# Patient Record
Sex: Male | Born: 2000 | Race: White | Hispanic: No | Marital: Single | Smoking: Never smoker
Health system: Southern US, Community
[De-identification: ages and names within clinical notes are randomized; demographics above are authoritative.]

---
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
## 2019-08-18 DIAGNOSIS — K219 Gastro-esophageal reflux disease without esophagitis: Secondary | ICD-10-CM | POA: Diagnosis present

## 2019-08-18 DIAGNOSIS — J3501 Chronic tonsillitis: Secondary | ICD-10-CM | POA: Insufficient documentation

## 2019-08-18 DIAGNOSIS — J029 Acute pharyngitis, unspecified: Secondary | ICD-10-CM | POA: Insufficient documentation

## 2020-05-20 ENCOUNTER — Encounter (HOSPITAL_COMMUNITY): Payer: Self-pay | Admitting: Emergency Medicine

## 2020-05-20 ENCOUNTER — Emergency Department (HOSPITAL_COMMUNITY)
Admission: EM | Admit: 2020-05-20 | Discharge: 2020-05-21 | Disposition: A | Discharge status: Home / Self Care | Payer: BC Managed Care – PPO | Attending: Emergency Medicine | Admitting: Emergency Medicine

## 2020-05-20 ENCOUNTER — Other Ambulatory Visit: Payer: Self-pay

## 2020-05-20 DIAGNOSIS — Z23 Encounter for immunization: Secondary | ICD-10-CM | POA: Diagnosis not present

## 2020-05-20 DIAGNOSIS — Y999 Unspecified external cause status: Secondary | ICD-10-CM | POA: Diagnosis not present

## 2020-05-20 DIAGNOSIS — X58XXXA Exposure to other specified factors, initial encounter: Secondary | ICD-10-CM | POA: Diagnosis not present

## 2020-05-20 DIAGNOSIS — Z203 Contact with and (suspected) exposure to rabies: Secondary | ICD-10-CM | POA: Diagnosis not present

## 2020-05-20 DIAGNOSIS — Y929 Unspecified place or not applicable: Secondary | ICD-10-CM | POA: Diagnosis not present

## 2020-05-20 DIAGNOSIS — S60312A Abrasion of left thumb, initial encounter: Secondary | ICD-10-CM | POA: Diagnosis not present

## 2020-05-20 DIAGNOSIS — Y939 Activity, unspecified: Secondary | ICD-10-CM | POA: Diagnosis not present

## 2020-05-20 DIAGNOSIS — Z2914 Encounter for prophylactic rabies immune globin: Secondary | ICD-10-CM | POA: Insufficient documentation

## 2020-05-20 DIAGNOSIS — Z209 Contact with and (suspected) exposure to unspecified communicable disease: Secondary | ICD-10-CM

## 2020-05-20 MED ORDER — RABIES VACCINE, PCEC IM SUSR
1.0000 mL | Freq: Once | INTRAMUSCULAR | Status: AC
  Administered 2020-05-21: 1 mL via INTRAMUSCULAR
  Filled 2020-05-20: qty 1

## 2020-05-20 MED ORDER — RABIES IMMUNE GLOBULIN 150 UNIT/ML IM INJ
20.0000 [IU]/kg | INJECTION | Freq: Once | INTRAMUSCULAR | Status: AC
  Administered 2020-05-21: 1875 [IU] via INTRAMUSCULAR
  Filled 2020-05-20: qty 12.5
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
## 2020-05-21 DIAGNOSIS — S60312A Abrasion of left thumb, initial encounter: Secondary | ICD-10-CM | POA: Diagnosis not present

## 2020-05-21 NOTE — Discharge Instructions (Addendum)
At this time there does not appear to be the presence of an emergent medical condition, however there is always the potential for conditions to change. Please read and follow the below instructions.  Please return to the Emergency Department immediately for any new or worsening symptoms. Please be sure to follow up with your Primary Care Provider within one week regarding your visit today; please call their office to schedule an appointment even if you are feeling better for a follow-up visit. You will need to return in 3 days for your next rabies vaccination.  Please follow the handout given to you today regarding your rabies vaccine follow-up schedule, shots will be needed on August 29, September 2 and September 9.  You may have these shots administered here at the emergency department or you may discuss follow-up vaccines with your primary care doctor so long as they are able to give him to you on the appropriate days.  Get help right away if you: Are bitten by a wild or stray animal. Have had any direct exposure to a bat. Have any symptoms of rabies infection. Have signs that your wound is infected, including: More redness, swelling, or pain around your wound. Fluid or blood coming from your wound. Pus or a bad smell coming from your wound. Your wound feels warm to the touch. You have any new/concerning or worsening of symptoms  Please read the additional information packets attached to your discharge summary.  Do not take your medicine if  develop an itchy rash, swelling in your mouth or lips, or difficulty breathing; call 911 and seek immediate emergency medical attention if this occurs.  You may review your lab tests and imaging results in their entirety on your MyChart account.  Please discuss all results of fully with your primary care provider and other specialist at your follow-up visit.  Note: Portions of this text may have been transcribed using voice recognition software. Every  effort was made to ensure accuracy; however, inadvertent computerized transcription errors may still be present.

## 2020-05-24 ENCOUNTER — Encounter (HOSPITAL_COMMUNITY): Payer: Self-pay | Admitting: Emergency Medicine

## 2020-05-24 ENCOUNTER — Ambulatory Visit (HOSPITAL_COMMUNITY)
Admission: EM | Admit: 2020-05-24 | Discharge: 2020-05-24 | Disposition: A | Discharge status: Home / Self Care | Payer: PRIVATE HEALTH INSURANCE

## 2020-05-24 ENCOUNTER — Other Ambulatory Visit: Payer: Self-pay

## 2020-05-24 DIAGNOSIS — Z203 Contact with and (suspected) exposure to rabies: Secondary | ICD-10-CM

## 2020-05-24 MED ORDER — RABIES VACCINE, PCEC IM SUSR
1.0000 mL | Freq: Once | INTRAMUSCULAR | Status: AC
  Administered 2020-05-24: 1 mL via INTRAMUSCULAR

## 2020-05-24 MED ORDER — RABIES VACCINE, PCEC IM SUSR
INTRAMUSCULAR | Status: AC
  Filled 2020-05-24: qty 1

## 2020-05-24 NOTE — ED Triage Notes (Signed)
Patient is in department for rabies vaccine

## 2020-05-24 NOTE — Discharge Instructions (Signed)
Return as scheduled for next injection

## 2020-05-27 ENCOUNTER — Ambulatory Visit (HOSPITAL_COMMUNITY)
Admission: EM | Admit: 2020-05-27 | Discharge: 2020-05-27 | Disposition: A | Discharge status: Home / Self Care | Payer: PRIVATE HEALTH INSURANCE | Attending: Urgent Care | Admitting: Urgent Care

## 2020-05-27 ENCOUNTER — Encounter (HOSPITAL_COMMUNITY): Payer: Self-pay | Admitting: Emergency Medicine

## 2020-05-27 ENCOUNTER — Other Ambulatory Visit: Payer: Self-pay

## 2020-05-27 DIAGNOSIS — Z889 Allergy status to unspecified drugs, medicaments and biological substances status: Secondary | ICD-10-CM

## 2020-05-27 DIAGNOSIS — R21 Rash and other nonspecific skin eruption: Secondary | ICD-10-CM | POA: Diagnosis not present

## 2020-05-27 DIAGNOSIS — Z23 Encounter for immunization: Secondary | ICD-10-CM

## 2020-05-27 MED ORDER — RABIES VACCINE, PCEC IM SUSR
1.0000 mL | Freq: Once | INTRAMUSCULAR | Status: AC
  Administered 2020-05-27: 1 mL via INTRAMUSCULAR

## 2020-05-27 MED ORDER — RABIES VACCINE, PCEC IM SUSR
INTRAMUSCULAR | Status: AC
  Filled 2020-05-27: qty 1

## 2020-05-27 MED ORDER — HYDROXYZINE HCL 25 MG PO TABS: 12.5000 mg | ORAL_TABLET | Freq: Three times a day (TID) | ORAL | 0 refills | Status: DC | PRN

## 2020-05-27 NOTE — ED Provider Notes (Signed)
  [facility]   MRN: [id number] DOB: Oct 30, 2000  Subjective:   Nicholas Dyer is a 19 y.o. male presenting for third rabies vaccination.  At triage, patient reported that he has had rashes with the first and second dose.  The first time, he had local reactions.  The second time he had rash over both arms and upset stomach, diarrhea.  Denies having had oral or facial swelling, shortness of breath or chest tightness, vomiting.  No current facility-administered medications for this encounter.  Current Outpatient Medications:  Marland Kitchen  Multiple Vitamin (MULTIVITAMIN) tablet, Take 1 tablet by mouth daily., Disp: , Rfl:    No Known Allergies  History reviewed. No pertinent past medical history.   History reviewed. No pertinent surgical history.  Family History  Problem Relation Age of Onset  . Healthy Mother   . Healthy Father     Social History   Tobacco Use  . Smoking status: Never Smoker  . Smokeless tobacco: Never Used  Substance Use Topics  . Alcohol use: Yes  . Drug use: Never    ROS   Objective:   Vitals: BP 128/84 (BP Location: Left Arm)   Pulse 79   Temp 98.6 F (37 C) (Oral)   Resp 14   SpO2 98%   Physical Exam Constitutional:      General: He is not in acute distress.    Appearance: Normal appearance. He is well-developed. He is not ill-appearing, toxic-appearing or diaphoretic.  HENT:     Head: Normocephalic and atraumatic.     Right Ear: External ear normal.     Left Ear: External ear normal.     Nose: Nose normal.     Mouth/Throat:     Mouth: Mucous membranes are moist.     Pharynx: Oropharynx is clear.     Comments: Airway patent both prior to and after vaccination.  Patient was evaluated 20 minutes after vaccine was administered. Eyes:     General: No scleral icterus.    Extraocular Movements: Extraocular movements intact.     Pupils: Pupils are equal, round, and reactive to light.  Cardiovascular:     Rate and Rhythm: Normal rate  and regular rhythm.     Heart sounds: Normal heart sounds. No murmur heard.  No friction rub. No gallop.   Pulmonary:     Effort: Pulmonary effort is normal. No respiratory distress.     Breath sounds: Normal breath sounds. No stridor. No wheezing, rhonchi or rales.  Skin:    Findings: No rash.  Neurological:     Mental Status: He is alert and oriented to person, place, and time.  Psychiatric:        Mood and Affect: Mood normal.        Behavior: Behavior normal.        Thought Content: Thought content normal.      Assessment and Plan :   PDMP not reviewed this encounter.  1. Rash and nonspecific skin eruption   2. Need for rabies vaccination   3. History of allergic drug reaction     I discussed with patient the likelihood that he is going to have a repeat local reaction.  Recommended hydroxyzine.  Low suspicion for anaphylaxis.  Recommend supportive care otherwise. Counseled patient on potential for adverse effects with medications prescribed/recommended today, ER and return-to-clinic precautions discussed, patient verbalized understanding.    Wallis Bamberg, [place] 05/27/20 1757

## 2020-05-27 NOTE — ED Notes (Signed)
Pt reports he has some pressure in his throat but otherwise feels fine. Vitals are stable

## 2020-05-27 NOTE — ED Triage Notes (Signed)
Pt presents for 3rd rabies vaccine. He states with initial rabies vaccine he had hives later. He states with 2nd vaccine he had hives, vomiting and diarrhea. Provider notified.

## 2020-06-03 ENCOUNTER — Ambulatory Visit (HOSPITAL_COMMUNITY)
Admission: EM | Admit: 2020-06-03 | Discharge: 2020-06-03 | Disposition: A | Discharge status: Home / Self Care | Payer: PRIVATE HEALTH INSURANCE | Attending: Family Medicine | Admitting: Family Medicine

## 2020-06-03 ENCOUNTER — Other Ambulatory Visit: Payer: Self-pay

## 2020-06-03 DIAGNOSIS — Z203 Contact with and (suspected) exposure to rabies: Secondary | ICD-10-CM | POA: Diagnosis not present

## 2020-06-03 MED ORDER — RABIES VACCINE, PCEC IM SUSR
1.0000 mL | Freq: Once | INTRAMUSCULAR | Status: AC
  Administered 2020-06-03: 1 mL via INTRAMUSCULAR

## 2020-06-03 MED ORDER — RABIES VACCINE, PCEC IM SUSR
INTRAMUSCULAR | Status: AC
  Filled 2020-06-03: qty 1

## 2020-06-03 NOTE — ED Triage Notes (Signed)
Pt presents to have final rabies injection. Denies any other complaints.

## 2020-07-31 ENCOUNTER — Observation Stay (HOSPITAL_COMMUNITY)
Admission: EM | Admit: 2020-07-31 | Discharge: 2020-08-01 | Disposition: A | Discharge status: Home / Self Care | Payer: BC Managed Care – PPO | Attending: General Surgery | Admitting: General Surgery

## 2020-07-31 ENCOUNTER — Emergency Department (HOSPITAL_COMMUNITY): Payer: BC Managed Care – PPO | Admitting: Anesthesiology

## 2020-07-31 ENCOUNTER — Encounter (HOSPITAL_COMMUNITY)
Admission: EM | Disposition: A | Discharge status: Home / Self Care | Payer: Self-pay | Source: Home / Self Care | Attending: Emergency Medicine

## 2020-07-31 ENCOUNTER — Emergency Department (HOSPITAL_COMMUNITY): Payer: BC Managed Care – PPO

## 2020-07-31 ENCOUNTER — Other Ambulatory Visit: Payer: Self-pay

## 2020-07-31 ENCOUNTER — Encounter (HOSPITAL_COMMUNITY): Payer: Self-pay

## 2020-07-31 DIAGNOSIS — K3589 Other acute appendicitis without perforation or gangrene: Secondary | ICD-10-CM | POA: Insufficient documentation

## 2020-07-31 DIAGNOSIS — K3580 Unspecified acute appendicitis: Principal | ICD-10-CM | POA: Insufficient documentation

## 2020-07-31 DIAGNOSIS — Z20822 Contact with and (suspected) exposure to covid-19: Secondary | ICD-10-CM | POA: Diagnosis not present

## 2020-07-31 DIAGNOSIS — K219 Gastro-esophageal reflux disease without esophagitis: Secondary | ICD-10-CM | POA: Diagnosis present

## 2020-07-31 DIAGNOSIS — R1010 Upper abdominal pain, unspecified: Secondary | ICD-10-CM | POA: Diagnosis present

## 2020-07-31 HISTORY — PX: LAPAROSCOPIC APPENDECTOMY: SHX408

## 2020-07-31 LAB — CBC WITH DIFFERENTIAL/PLATELET
Abs Immature Granulocytes: 0.07 10*3/uL (ref 0.00–0.07)
Basophils Absolute: 0.1 10*3/uL (ref 0.0–0.1)
Basophils Relative: 0 %
Eosinophils Absolute: 0 10*3/uL (ref 0.0–0.5)
Eosinophils Relative: 0 %
HCT: 48.6 % (ref 39.0–52.0)
Hemoglobin: 16.6 g/dL (ref 13.0–17.0)
Immature Granulocytes: 0 %
Lymphocytes Relative: 4 %
Lymphs Abs: 0.6 10*3/uL — ABNORMAL LOW (ref 0.7–4.0)
MCH: 31.2 pg (ref 26.0–34.0)
MCHC: 34.2 g/dL (ref 30.0–36.0)
MCV: 91.4 fL (ref 80.0–100.0)
Monocytes Absolute: 0.8 10*3/uL (ref 0.1–1.0)
Monocytes Relative: 5 %
Neutro Abs: 14.9 10*3/uL — ABNORMAL HIGH (ref 1.7–7.7)
Neutrophils Relative %: 91 %
Platelets: 341 10*3/uL (ref 150–400)
RBC: 5.32 MIL/uL (ref 4.22–5.81)
RDW: 12.2 % (ref 11.5–15.5)
WBC: 16.4 10*3/uL — ABNORMAL HIGH (ref 4.0–10.5)
nRBC: 0 % (ref 0.0–0.2)

## 2020-07-31 LAB — COMPREHENSIVE METABOLIC PANEL
ALT: 34 U/L (ref 0–44)
AST: 25 U/L (ref 15–41)
Albumin: 5.5 g/dL — ABNORMAL HIGH (ref 3.5–5.0)
Alkaline Phosphatase: 75 U/L (ref 38–126)
Anion gap: 10 (ref 5–15)
BUN: 18 mg/dL (ref 6–20)
CO2: 26 mmol/L (ref 22–32)
Calcium: 9.8 mg/dL (ref 8.9–10.3)
Chloride: 105 mmol/L (ref 98–111)
Creatinine, Ser: 0.98 mg/dL (ref 0.61–1.24)
GFR, Estimated: >60 mL/min (ref >60)
Glucose, Bld: 97 mg/dL (ref 70–99)
Potassium: 4.6 mmol/L (ref 3.5–5.1)
Sodium: 141 mmol/L (ref 135–145)
Total Bilirubin: 1.3 mg/dL — ABNORMAL HIGH (ref 0.3–1.2)
Total Protein: 8.4 g/dL — ABNORMAL HIGH (ref 6.5–8.1)

## 2020-07-31 LAB — URINALYSIS, ROUTINE W REFLEX MICROSCOPIC
Bilirubin Urine: NEGATIVE
Glucose, UA: NEGATIVE mg/dL
Hgb urine dipstick: NEGATIVE
Ketones, ur: 80 mg/dL — AB
Leukocytes,Ua: NEGATIVE
Nitrite: NEGATIVE
Protein, ur: NEGATIVE mg/dL
Specific Gravity, Urine: 1.021 (ref 1.005–1.030)
pH: 5 (ref 5.0–8.0)

## 2020-07-31 LAB — RESPIRATORY PANEL BY RT PCR (FLU A&B, COVID)
Influenza A by PCR: NEGATIVE
Influenza B by PCR: NEGATIVE
SARS Coronavirus 2 by RT PCR: NEGATIVE

## 2020-07-31 LAB — LIPASE, BLOOD: Lipase: 24 U/L (ref 11–51)

## 2020-07-31 SURGERY — APPENDECTOMY, LAPAROSCOPIC
Anesthesia: General | Site: Abdomen

## 2020-07-31 MED ORDER — ONDANSETRON HCL 4 MG/2ML IJ SOLN
INTRAMUSCULAR | Status: AC
  Filled 2020-07-31: qty 2

## 2020-07-31 MED ORDER — SUCCINYLCHOLINE CHLORIDE 200 MG/10ML IV SOSY
PREFILLED_SYRINGE | INTRAVENOUS | Status: AC
  Filled 2020-07-31: qty 10

## 2020-07-31 MED ORDER — METRONIDAZOLE IN NACL 5-0.79 MG/ML-% IV SOLN
500.0000 mg | Freq: Once | INTRAVENOUS | Status: AC
  Administered 2020-07-31: 500 mg via INTRAVENOUS
  Filled 2020-07-31: qty 100

## 2020-07-31 MED ORDER — CHLORHEXIDINE GLUCONATE CLOTH 2 % EX PADS: 6.0000 | MEDICATED_PAD | Freq: Once | CUTANEOUS | Status: DC

## 2020-07-31 MED ORDER — SODIUM CHLORIDE 0.9 % IV SOLN
2.0000 g | INTRAVENOUS | Status: AC
  Administered 2020-07-31: 2 g via INTRAVENOUS
  Filled 2020-07-31: qty 2

## 2020-07-31 MED ORDER — IOHEXOL 300 MG/ML  SOLN
100.0000 mL | Freq: Once | INTRAMUSCULAR | Status: AC | PRN
  Administered 2020-07-31: 100 mL via INTRAVENOUS

## 2020-07-31 MED ORDER — MORPHINE SULFATE (PF) 2 MG/ML IV SOLN
2.0000 mg | Freq: Once | INTRAVENOUS | Status: AC
  Administered 2020-07-31: 2 mg via INTRAVENOUS
  Filled 2020-07-31: qty 1

## 2020-07-31 MED ORDER — SUCCINYLCHOLINE CHLORIDE 200 MG/10ML IV SOSY
PREFILLED_SYRINGE | INTRAVENOUS | Status: DC | PRN
  Administered 2020-07-31: 140 mg via INTRAVENOUS

## 2020-07-31 MED ORDER — ONDANSETRON HCL 4 MG/2ML IJ SOLN
4.0000 mg | Freq: Once | INTRAMUSCULAR | Status: AC
  Administered 2020-07-31: 4 mg via INTRAVENOUS
  Filled 2020-07-31: qty 2

## 2020-07-31 MED ORDER — BUPIVACAINE-EPINEPHRINE (PF) 0.5% -1:200000 IJ SOLN
INTRAMUSCULAR | Status: AC
  Filled 2020-07-31: qty 30

## 2020-07-31 MED ORDER — ROCURONIUM BROMIDE 10 MG/ML (PF) SYRINGE
PREFILLED_SYRINGE | INTRAVENOUS | Status: DC | PRN
  Administered 2020-07-31: 40 mg via INTRAVENOUS

## 2020-07-31 MED ORDER — MEPERIDINE HCL 50 MG/ML IJ SOLN: 6.2500 mg | INTRAMUSCULAR | Status: DC | PRN

## 2020-07-31 MED ORDER — CELECOXIB 200 MG PO CAPS
200.0000 mg | ORAL_CAPSULE | ORAL | Status: AC
  Administered 2020-07-31: 200 mg via ORAL
  Filled 2020-07-31: qty 1

## 2020-07-31 MED ORDER — SODIUM CHLORIDE (PF) 0.9 % IJ SOLN
INTRAMUSCULAR | Status: AC
  Filled 2020-07-31: qty 20

## 2020-07-31 MED ORDER — ACETAMINOPHEN 500 MG PO TABS
1000.0000 mg | ORAL_TABLET | Freq: Four times a day (QID) | ORAL | Status: DC
  Administered 2020-08-01: 1000 mg via ORAL
  Filled 2020-07-31: qty 2

## 2020-07-31 MED ORDER — HYDROXYZINE HCL 25 MG PO TABS: 12.5000 mg | ORAL_TABLET | Freq: Three times a day (TID) | ORAL | Status: DC | PRN

## 2020-07-31 MED ORDER — ROCURONIUM BROMIDE 10 MG/ML (PF) SYRINGE
PREFILLED_SYRINGE | INTRAVENOUS | Status: AC
  Filled 2020-07-31: qty 20

## 2020-07-31 MED ORDER — HYDROMORPHONE HCL 1 MG/ML IJ SOLN
INTRAMUSCULAR | Status: AC
  Filled 2020-07-31: qty 1

## 2020-07-31 MED ORDER — SUGAMMADEX SODIUM 200 MG/2ML IV SOLN
INTRAVENOUS | Status: DC | PRN
  Administered 2020-07-31: 200 mg via INTRAVENOUS

## 2020-07-31 MED ORDER — ENOXAPARIN SODIUM 40 MG/0.4ML ~~LOC~~ SOLN
40.0000 mg | SUBCUTANEOUS | Status: DC
  Administered 2020-08-01: 10:00:00 40 mg via SUBCUTANEOUS
  Filled 2020-07-31: qty 0.4

## 2020-07-31 MED ORDER — DIPHENHYDRAMINE HCL 12.5 MG/5ML PO ELIX: 12.5000 mg | ORAL_SOLUTION | Freq: Four times a day (QID) | ORAL | Status: DC | PRN

## 2020-07-31 MED ORDER — DEXAMETHASONE SODIUM PHOSPHATE 10 MG/ML IJ SOLN
INTRAMUSCULAR | Status: DC | PRN
  Administered 2020-07-31: 10 mg via INTRAVENOUS

## 2020-07-31 MED ORDER — SODIUM CHLORIDE 0.9 % IV BOLUS
1000.0000 mL | Freq: Once | INTRAVENOUS | Status: AC
  Administered 2020-07-31: 1000 mL via INTRAVENOUS

## 2020-07-31 MED ORDER — NALOXONE HCL 0.4 MG/ML IJ SOLN
INTRAMUSCULAR | Status: DC | PRN
  Administered 2020-07-31: 200 ug via INTRAVENOUS

## 2020-07-31 MED ORDER — ONE-DAILY MULTI VITAMINS PO TABS: 1.0000 | ORAL_TABLET | Freq: Every day | ORAL | Status: DC

## 2020-07-31 MED ORDER — MAGNESIUM HYDROXIDE 400 MG/5ML PO SUSP: 30.0000 mL | Freq: Every day | ORAL | Status: DC | PRN

## 2020-07-31 MED ORDER — CEFTRIAXONE SODIUM 2 G IJ SOLR
2.0000 g | Freq: Once | INTRAMUSCULAR | Status: AC
  Administered 2020-07-31: 2 g via INTRAVENOUS
  Filled 2020-07-31: qty 20

## 2020-07-31 MED ORDER — SODIUM CHLORIDE 0.9 % IV SOLN: Freq: Three times a day (TID) | INTRAVENOUS | Status: DC | PRN

## 2020-07-31 MED ORDER — SODIUM CHLORIDE 0.9% FLUSH: 3.0000 mL | Freq: Two times a day (BID) | INTRAVENOUS | Status: DC

## 2020-07-31 MED ORDER — BISMUTH SUBSALICYLATE 262 MG PO CHEW
524.0000 mg | CHEWABLE_TABLET | ORAL | Status: DC | PRN
  Filled 2020-07-31: qty 2

## 2020-07-31 MED ORDER — HYDROMORPHONE HCL 1 MG/ML IJ SOLN
0.2500 mg | INTRAMUSCULAR | Status: DC | PRN
  Administered 2020-07-31 (×4): 0.5 mg via INTRAVENOUS

## 2020-07-31 MED ORDER — PHENYLEPHRINE 40 MCG/ML (10ML) SYRINGE FOR IV PUSH (FOR BLOOD PRESSURE SUPPORT)
PREFILLED_SYRINGE | INTRAVENOUS | Status: AC
  Filled 2020-07-31: qty 20

## 2020-07-31 MED ORDER — SODIUM CHLORIDE 0.9 % IV SOLN
2.0000 g | INTRAVENOUS | Status: DC
  Filled 2020-07-31: qty 20

## 2020-07-31 MED ORDER — SODIUM CHLORIDE (PF) 0.9 % IJ SOLN
INTRAMUSCULAR | Status: DC | PRN
  Administered 2020-07-31: 20 mL

## 2020-07-31 MED ORDER — OXYCODONE HCL 5 MG PO TABS
5.0000 mg | ORAL_TABLET | ORAL | Status: DC | PRN
  Administered 2020-08-01: 10 mg via ORAL
  Filled 2020-07-31: qty 2

## 2020-07-31 MED ORDER — BUPIVACAINE LIPOSOME 1.3 % IJ SUSP
INTRAMUSCULAR | Status: DC | PRN
  Administered 2020-07-31: 20 mL

## 2020-07-31 MED ORDER — 0.9 % SODIUM CHLORIDE (POUR BTL) OPTIME
TOPICAL | Status: DC | PRN
  Administered 2020-07-31: 1000 mL

## 2020-07-31 MED ORDER — LIDOCAINE 2% (20 MG/ML) 5 ML SYRINGE
INTRAMUSCULAR | Status: AC
  Filled 2020-07-31: qty 5

## 2020-07-31 MED ORDER — FENTANYL CITRATE (PF) 250 MCG/5ML IJ SOLN
INTRAMUSCULAR | Status: AC
  Filled 2020-07-31: qty 5

## 2020-07-31 MED ORDER — LACTATED RINGERS IV BOLUS: 1000.0000 mL | Freq: Three times a day (TID) | INTRAVENOUS | Status: DC | PRN

## 2020-07-31 MED ORDER — LACTATED RINGERS IV SOLN: INTRAVENOUS | Status: DC | PRN

## 2020-07-31 MED ORDER — LACTATED RINGERS IV SOLN: INTRAVENOUS | Status: DC

## 2020-07-31 MED ORDER — TRAMADOL HCL 50 MG PO TABS
50.0000 mg | ORAL_TABLET | Freq: Four times a day (QID) | ORAL | Status: DC | PRN
  Administered 2020-07-31 – 2020-08-01 (×2): 50 mg via ORAL
  Filled 2020-07-31 (×2): qty 1

## 2020-07-31 MED ORDER — EPHEDRINE 5 MG/ML INJ
INTRAVENOUS | Status: AC
  Filled 2020-07-31: qty 10

## 2020-07-31 MED ORDER — ONDANSETRON HCL 4 MG/2ML IJ SOLN
INTRAMUSCULAR | Status: DC | PRN
  Administered 2020-07-31: 4 mg via INTRAVENOUS

## 2020-07-31 MED ORDER — MAGIC MOUTHWASH
15.0000 mL | Freq: Four times a day (QID) | ORAL | Status: DC | PRN
  Filled 2020-07-31: qty 15

## 2020-07-31 MED ORDER — SIMETHICONE 80 MG PO CHEW: 40.0000 mg | CHEWABLE_TABLET | Freq: Four times a day (QID) | ORAL | Status: DC | PRN

## 2020-07-31 MED ORDER — BISACODYL 10 MG RE SUPP: 10.0000 mg | Freq: Every day | RECTAL | Status: DC | PRN

## 2020-07-31 MED ORDER — TRAMADOL HCL 50 MG PO TABS: 50.0000 mg | ORAL_TABLET | Freq: Four times a day (QID) | ORAL | 0 refills | Status: DC | PRN

## 2020-07-31 MED ORDER — METOPROLOL TARTRATE 5 MG/5ML IV SOLN: 5.0000 mg | Freq: Four times a day (QID) | INTRAVENOUS | Status: DC | PRN

## 2020-07-31 MED ORDER — LIP MEDEX EX OINT: 1.0000 "application " | TOPICAL_OINTMENT | Freq: Two times a day (BID) | CUTANEOUS | Status: DC

## 2020-07-31 MED ORDER — LIDOCAINE 2% (20 MG/ML) 5 ML SYRINGE
INTRAMUSCULAR | Status: DC | PRN
  Administered 2020-07-31: 80 mg via INTRAVENOUS

## 2020-07-31 MED ORDER — SODIUM CHLORIDE 0.9 % IV SOLN: 250.0000 mL | INTRAVENOUS | Status: DC | PRN

## 2020-07-31 MED ORDER — MIDAZOLAM HCL 5 MG/5ML IJ SOLN
INTRAMUSCULAR | Status: DC | PRN
  Administered 2020-07-31: 2 mg via INTRAVENOUS

## 2020-07-31 MED ORDER — PROPOFOL 10 MG/ML IV BOLUS
INTRAVENOUS | Status: DC | PRN
  Administered 2020-07-31: 130 mg via INTRAVENOUS

## 2020-07-31 MED ORDER — METRONIDAZOLE IN NACL 5-0.79 MG/ML-% IV SOLN
500.0000 mg | Freq: Three times a day (TID) | INTRAVENOUS | Status: DC
  Administered 2020-08-01 (×2): 500 mg via INTRAVENOUS
  Filled 2020-07-31 (×2): qty 100

## 2020-07-31 MED ORDER — PSYLLIUM 95 % PO PACK
1.0000 | PACK | Freq: Every day | ORAL | Status: DC
  Administered 2020-08-01: 1 via ORAL
  Filled 2020-07-31: qty 1

## 2020-07-31 MED ORDER — GABAPENTIN 300 MG PO CAPS
300.0000 mg | ORAL_CAPSULE | Freq: Two times a day (BID) | ORAL | Status: DC
  Administered 2020-07-31 – 2020-08-01 (×2): 300 mg via ORAL
  Filled 2020-07-31 (×2): qty 1

## 2020-07-31 MED ORDER — LACTATED RINGERS IR SOLN
Status: DC | PRN
  Administered 2020-07-31: 1000 mL

## 2020-07-31 MED ORDER — ZOLPIDEM TARTRATE 5 MG PO TABS: 5.0000 mg | ORAL_TABLET | Freq: Every evening | ORAL | Status: DC | PRN

## 2020-07-31 MED ORDER — IBUPROFEN 200 MG PO TABS: 200.0000 mg | ORAL_TABLET | Freq: Four times a day (QID) | ORAL | Status: DC | PRN

## 2020-07-31 MED ORDER — ACETAMINOPHEN 500 MG PO TABS
1000.0000 mg | ORAL_TABLET | ORAL | Status: AC
  Administered 2020-07-31: 1000 mg via ORAL
  Filled 2020-07-31: qty 2

## 2020-07-31 MED ORDER — FENTANYL CITRATE (PF) 100 MCG/2ML IJ SOLN
INTRAMUSCULAR | Status: DC | PRN
  Administered 2020-07-31 (×5): 50 ug via INTRAVENOUS

## 2020-07-31 MED ORDER — BUPIVACAINE-EPINEPHRINE (PF) 0.5% -1:200000 IJ SOLN
INTRAMUSCULAR | Status: DC | PRN
  Administered 2020-07-31: 30 mL

## 2020-07-31 MED ORDER — SODIUM CHLORIDE 0.9% FLUSH: 3.0000 mL | INTRAVENOUS | Status: DC | PRN

## 2020-07-31 MED ORDER — DIPHENHYDRAMINE HCL 50 MG/ML IJ SOLN: 12.5000 mg | Freq: Four times a day (QID) | INTRAMUSCULAR | Status: DC | PRN

## 2020-07-31 MED ORDER — BUPIVACAINE LIPOSOME 1.3 % IJ SUSP
20.0000 mL | Freq: Once | INTRAMUSCULAR | Status: DC
  Filled 2020-07-31: qty 20

## 2020-07-31 MED ORDER — MORPHINE SULFATE (PF) 2 MG/ML IV SOLN: 2.0000 mg | Freq: Once | INTRAVENOUS | Status: DC

## 2020-07-31 MED ORDER — PROPOFOL 10 MG/ML IV BOLUS
INTRAVENOUS | Status: AC
  Filled 2020-07-31: qty 20

## 2020-07-31 MED ORDER — METHOCARBAMOL 500 MG PO TABS
500.0000 mg | ORAL_TABLET | Freq: Four times a day (QID) | ORAL | Status: DC | PRN
  Administered 2020-07-31: 21:00:00 500 mg via ORAL
  Filled 2020-07-31: qty 1

## 2020-07-31 MED ORDER — MIDAZOLAM HCL 2 MG/2ML IJ SOLN
INTRAMUSCULAR | Status: AC
  Filled 2020-07-31: qty 2

## 2020-07-31 MED ORDER — ADULT MULTIVITAMIN W/MINERALS CH
1.0000 | ORAL_TABLET | Freq: Every day | ORAL | Status: DC
  Administered 2020-07-31 – 2020-08-01 (×2): 1 via ORAL
  Filled 2020-07-31 (×2): qty 1

## 2020-07-31 MED ORDER — METHOCARBAMOL 500 MG PO TABS: 1000.0000 mg | ORAL_TABLET | Freq: Four times a day (QID) | ORAL | Status: DC | PRN

## 2020-07-31 MED ORDER — GABAPENTIN 300 MG PO CAPS
300.0000 mg | ORAL_CAPSULE | ORAL | Status: AC
  Administered 2020-07-31: 300 mg via ORAL
  Filled 2020-07-31: qty 1

## 2020-07-31 MED ORDER — ONDANSETRON HCL 4 MG/2ML IJ SOLN: 4.0000 mg | Freq: Once | INTRAMUSCULAR | Status: DC | PRN

## 2020-07-31 SURGICAL SUPPLY — 48 items
APPLIER CLIP 5 13 M/L LIGAMAX5 (MISCELLANEOUS)
APPLIER CLIP ROT 10 11.4 M/L (STAPLE)
CABLE HIGH FREQUENCY MONO STRZ (ELECTRODE) ×2 IMPLANT
CHLORAPREP W/TINT 26 (MISCELLANEOUS) ×2 IMPLANT
CLIP APPLIE 5 13 M/L LIGAMAX5 (MISCELLANEOUS) IMPLANT
CLIP APPLIE ROT 10 11.4 M/L (STAPLE) IMPLANT
COVER SURGICAL LIGHT HANDLE (MISCELLANEOUS) ×2 IMPLANT
COVER WAND RF STERILE (DRAPES) IMPLANT
DECANTER SPIKE VIAL GLASS SM (MISCELLANEOUS) ×2 IMPLANT
DEVICE TROCAR PUNCTURE CLOSURE (ENDOMECHANICALS) IMPLANT
DRAPE LAPAROSCOPIC ABDOMINAL (DRAPES) IMPLANT
DRAPE WARM FLUID 44X44 (DRAPES) ×2 IMPLANT
DRSG TEGADERM 2-3/8X2-3/4 SM (GAUZE/BANDAGES/DRESSINGS) ×2 IMPLANT
DRSG TEGADERM 4X4.75 (GAUZE/BANDAGES/DRESSINGS) ×2 IMPLANT
ELECT REM PT RETURN 15FT ADLT (MISCELLANEOUS) ×2 IMPLANT
ENDOLOOP SUT PDS II  0 18 (SUTURE)
ENDOLOOP SUT PDS II 0 18 (SUTURE) IMPLANT
GAUZE SPONGE 2X2 8PLY STRL LF (GAUZE/BANDAGES/DRESSINGS) ×1 IMPLANT
GLOVE ECLIPSE 8.0 STRL XLNG CF (GLOVE) ×2 IMPLANT
GLOVE INDICATOR 8.0 STRL GRN (GLOVE) ×2 IMPLANT
GOWN STRL REUS W/TWL XL LVL3 (GOWN DISPOSABLE) ×4 IMPLANT
IRRIG SUCT STRYKERFLOW 2 WTIP (MISCELLANEOUS) ×2
IRRIGATION SUCT STRKRFLW 2 WTP (MISCELLANEOUS) ×1 IMPLANT
KIT BASIN OR (CUSTOM PROCEDURE TRAY) ×2 IMPLANT
KIT TURNOVER KIT A (KITS) IMPLANT
PAD POSITIONING PINK XL (MISCELLANEOUS) ×2 IMPLANT
PENCIL SMOKE EVACUATOR (MISCELLANEOUS) IMPLANT
POUCH RETRIEVAL ECOSAC 10 (ENDOMECHANICALS) ×1 IMPLANT
POUCH RETRIEVAL ECOSAC 10MM (ENDOMECHANICALS) ×2
RELOAD STAPLER BLUE 60MM (STAPLE) ×1 IMPLANT
RELOAD STAPLER GREEN 60MM (STAPLE) IMPLANT
SCISSORS LAP 5X35 DISP (ENDOMECHANICALS) ×2 IMPLANT
SET TUBE SMOKE EVAC HIGH FLOW (TUBING) ×2 IMPLANT
SHEARS HARMONIC ACE PLUS 36CM (ENDOMECHANICALS) ×2 IMPLANT
SLEEVE XCEL OPT CAN 5 100 (ENDOMECHANICALS) ×2 IMPLANT
SPONGE GAUZE 2X2 STER 10/PKG (GAUZE/BANDAGES/DRESSINGS) ×1
STAPLE ECHEON FLEX 60 POW ENDO (STAPLE) ×2 IMPLANT
STAPLER RELOAD BLUE 60MM (STAPLE) ×2
STAPLER RELOAD GREEN 60MM (STAPLE)
SUT MNCRL AB 4-0 PS2 18 (SUTURE) ×2 IMPLANT
SUT PDS AB 0 CT1 36 (SUTURE) IMPLANT
SUT PDS AB 1 CT1 27 (SUTURE) IMPLANT
SUT SILK 2 0 SH (SUTURE) IMPLANT
TOWEL OR 17X26 10 PK STRL BLUE (TOWEL DISPOSABLE) ×2 IMPLANT
TRAY FOLEY MTR SLVR 16FR STAT (SET/KITS/TRAYS/PACK) ×2 IMPLANT
TRAY LAPAROSCOPIC (CUSTOM PROCEDURE TRAY) ×2 IMPLANT
TROCAR BLADELESS OPT 5 100 (ENDOMECHANICALS) ×2 IMPLANT
TROCAR XCEL 12X100 BLDLESS (ENDOMECHANICALS) ×2 IMPLANT

## 2020-07-31 NOTE — Discharge Instructions (Signed)
LAPAROSCOPIC SURGERY: POST OP INSTRUCTIONS  ######################################################################  EAT Gradually transition to a high fiber diet with a fiber supplement over the next few weeks after discharge.  Start with a pureed / full liquid diet (see below)  WALK Walk an hour a day.  Control your pain to do that.    CONTROL PAIN Control pain so that you can walk, sleep, tolerate sneezing/coughing, go up/down stairs.  HAVE A BOWEL MOVEMENT DAILY Keep your bowels regular to avoid problems.  OK to try a laxative to override constipation.  OK to use an antidairrheal to slow down diarrhea.  Call if not better after 2 tries  CALL IF YOU HAVE PROBLEMS/CONCERNS Call if you are still struggling despite following these instructions. Call if you have concerns not answered by these instructions  ######################################################################    1. DIET: Follow a light bland diet & liquids the first 24 hours after arrival home, such as soup, liquids, starches, etc.  Be sure to drink plenty of fluids.  Quickly advance to a usual solid diet within a few days.  Avoid fast food or heavy meals as your are more likely to get nauseated or have irregular bowels.  A low-fat, high-fiber diet for the rest of your life is ideal.  2. Take your usually prescribed home medications unless otherwise directed.  3. PAIN CONTROL: a. Pain is best controlled by a usual combination of three different methods TOGETHER: i. Ice/Heat ii. Over the counter pain medication iii. Prescription pain medication b. Most patients will experience some swelling and bruising around the incisions.  Ice packs or heating pads (30-60 minutes up to 6 times a day) will help. Use ice for the first few days to help decrease swelling and bruising, then switch to heat to help relax tight/sore spots and speed recovery.  Some people prefer to use ice alone, heat alone, alternating between ice & heat.   Experiment to what works for you.  Swelling and bruising can take several weeks to resolve.   c. It is helpful to take an over-the-counter pain medication regularly for the first few weeks.  Choose one of the following that works best for you: i. Naproxen (Aleve, etc)  Two 220mg tabs twice a day ii. Ibuprofen (Advil, etc) Three 200mg tabs four times a day (every meal & bedtime) iii. Acetaminophen (Tylenol, etc) 500-650mg four times a day (every meal & bedtime) d. A  prescription for pain medication (such as oxycodone, hydrocodone, tramadol, gabapentin, methocarbamol, etc) should be given to you upon discharge.  Take your pain medication as prescribed.  i. If you are having problems/concerns with the prescription medicine (does not control pain, nausea, vomiting, rash, itching, etc), please call us [phone] to see if we need to switch you to a different pain medicine that will work better for you and/or control your side effect better. ii. If you need a refill on your pain medication, please give us 48 hour notice.  contact your pharmacy.  They will contact our office to request authorization. Prescriptions will not be filled after 5 pm or on week-ends  4. Avoid getting constipated.   a. Between the surgery and the pain medications, it is common to experience some constipation.   b. Increasing fluid intake and taking a fiber supplement (such as Metamucil, Citrucel, FiberCon, MiraLax, etc) 1-2 times a day regularly will usually help prevent this problem from occurring.   c. A mild laxative (prune juice, Milk of Magnesia, MiraLax, etc) should be taken according to   package directions if there are no bowel movements after 48 hours.   5. Watch out for diarrhea.   a. If you have many loose bowel movements, simplify your diet to bland foods & liquids for a few days.   b. Stop any stool softeners and decrease your fiber supplement.   c. Switching to mild anti-diarrheal medications (Kayopectate, Pepto  Bismol) can help.   d. If this worsens or does not improve, please call us.  6. Wash / shower every day.  You may shower over the dressings as they are waterproof.  Continue to shower over incision(s) after the dressing is off.  7. Remove your waterproof bandages 3 days after surgery.  You may leave the incision open to air.  You may replace a dressing/Band-Aid to cover the incision for comfort if you wish.   8. ACTIVITIES as tolerated:   i. You may resume regular (light) daily activities beginning the next day--such as daily self-care, walking, climbing stairs--gradually increasing activities as tolerated.  If you can walk 30 minutes without difficulty, it is safe to try more intense activity such as jogging, treadmill, bicycling, low-impact aerobics, swimming, etc. ii. Save the most intensive and strenuous activity for last such as sit-ups, heavy lifting, contact sports, etc  Refrain from any heavy lifting or straining until you are off narcotics for pain control.   iii. DO NOT PUSH THROUGH PAIN.  Let pain be your guide: If it hurts to do something, don't do it.  Pain is your body warning you to avoid that activity for another week until the pain goes down. iv. You may drive when you are no longer taking prescription pain medication, you can comfortably wear a seatbelt, and you can safely maneuver your car and apply brakes. v. You may have sexual intercourse when it is comfortable.  9. FOLLOW UP in our office a. Please call [facility] at [phone] to set up an appointment to see your surgeon in the office for a follow-up appointment approximately 2-3 weeks after your surgery. b. Make sure that you call for this appointment the day you arrive home to insure a convenient appointment time.  10. IF YOU HAVE DISABILITY OR FAMILY LEAVE FORMS, BRING THEM TO THE OFFICE FOR PROCESSING.  DO NOT GIVE THEM TO YOUR DOCTOR.   WHEN TO CALL us [phone]: 1. Poor pain control 2. Reactions / problems with  new medications (rash/itching, nausea, etc)  3. Fever over 101.5 F (38.5 C) 4. Inability to urinate 5. Nausea and/or vomiting 6. Worsening swelling or bruising 7. Continued bleeding from incision. 8. Increased pain, redness, or drainage from the incision   The clinic staff is available to answer your questions during regular business hours (8:30am-5pm).  Please don't hesitate to call and ask to speak to one of our nurses for clinical concerns.   If you have a medical emergency, go to the nearest emergency room or call 911.  A surgeon from [facility] is always on call at the [facility], [place] [address], [address], [place], [place]  [address] ? MAIN: [phone] ? TOLL FREE: [phone] ?  FAX [phone] [email or web address]     Appendicitis, Adult  The appendix is a tube in the body that is shaped like a finger. It is attached to the large intestine. Appendicitis means that this tube is swollen (inflamed). If this is not treated, the tube can tear (rupture). This can lead to a life-threatening infection.  This condition can also cause pus to build up in the appendix (abscess). What are the causes? This condition may be caused by something that blocks the appendix. These include:  A ball of poop (stool).  Lymph glands that are bigger than normal. Sometimes the cause is not known. What increases the risk? You are more likely to develop this condition if you are between 109 and 84 years of age. What are the signs or symptoms? Symptoms of this condition include:  Pain around the belly button (navel). ? The pain moves toward the lower right belly (abdomen). ? The pain can get worse with time. ? The pain can get worse if you cough. ? The pain can get worse if you move suddenly.  Tenderness in the lower right belly.  Feeling sick to your stomach (nauseous).  Throwing up (vomiting).  Not feeling hungry (loss of  appetite).  A fever.  Having trouble pooping (constipation).  Watery poop (diarrhea).  Not feeling well. How is this treated? Most often, this condition is treated by taking out the appendix (appendectomy). There are two ways to do this:  Open surgery. For this method, the appendix is taken out through a large cut (incision). The cut is made in the lower right belly. This surgery may be used if: ? You have scars from another surgery. ? You have a bleeding condition. ? You are pregnant and will be having your baby soon. ? You have a condition that makes it hard to do the other type of surgery.  Laparoscopic surgery. For this method, the appendix is taken out through small cuts. Often, this surgery: ? Causes less pain. ? Causes fewer problems. ? Is easier to heal from. If your appendix tears and pus forms:  A drain may be put into the sore. The drain will be used to get rid of the pus.  You may get an antibiotic medicine through an IV line.  Your appendix may or may not need to be taken out. Follow these instructions at home: If you had surgery, follow instructions from your doctor on how to care for yourself at home and how to take care of your cut from surgery. Medicines  Take over-the-counter and prescription medicines only as told by your doctor.  If you were prescribed an antibiotic medicine, take it as told by your doctor. Do not stop taking the antibiotic even if you start to feel better. Eating and drinking Follow instructions from your doctor about what you cannot eat or drink. You may go back to your diet slowly if:  You no longer feel sick to your stomach.  You have stopped throwing up. General instructions  Do not use any products that contain nicotine or tobacco, such as cigarettes, e-cigarettes, and chewing tobacco. If you need help quitting, ask your doctor.  Do not drive or use heavy machinery while taking prescription pain medicine.  Ask your doctor if  the medicine you are taking can cause trouble pooping. You may need to take steps to prevent or treat trouble pooping: ? Drink enough fluid to keep your pee (urine) pale yellow. ? Take over-the-counter or prescription medicines. ? Eat foods that are high in fiber. These include beans, whole grains, and fresh fruits and vegetables. ? Limit foods that are high in fat and sugar. These include fried or sweet foods.  Keep all follow-up visits as told by your doctor. This is important. Contact a doctor if:  There is pus, blood, or a lot  of fluid coming from your cut or cuts from surgery.  You are sick to your stomach or you throw up. Get help right away if:  You have pain in your belly, and the pain is getting worse.  You have a fever.  You have chills.  You are very tired.  You have muscle pain.  You are short of breath. Summary  Appendicitis is swelling of the appendix. The appendix is a tube that is shaped like a finger. It is joined to the large intestine.  This condition may be caused by something that blocks the appendix. This can lead to an infection.  This condition is usually treated by taking out the appendix. This information is not intended to replace advice given to you by your health care provider. Make sure you discuss any questions you have with your health care provider. Document Revised: 02/27/2018 Document Reviewed: 02/27/2018 Elsevier Patient Education  2020 [employer].

## 2020-07-31 NOTE — Anesthesia Procedure Notes (Signed)
Procedure Name: Intubation Date/Time: 07/31/2020 5:25 PM Performed by: Montel Clock, CRNA Pre-anesthesia Checklist: Patient identified, Emergency Drugs available, Suction available, Patient being monitored and Timeout performed Patient Re-evaluated:Patient Re-evaluated prior to induction Oxygen Delivery Method: Circle system utilized Preoxygenation: Pre-oxygenation with 100% oxygen Induction Type: IV induction, Rapid sequence and Cricoid Pressure applied Laryngoscope Size: Mac and 3 Grade View: Grade I Tube type: Oral Tube size: 7.5 mm Number of attempts: 1 Airway Equipment and Method: Stylet Placement Confirmation: ETT inserted through vocal cords under direct vision,  positive ETCO2 and breath sounds checked- equal and bilateral Secured at: 23 cm Tube secured with: Tape Dental Injury: Teeth and Oropharynx as per pre-operative assessment

## 2020-07-31 NOTE — H&P (Signed)
Nicholas Dyer  May 08, 2001 [id number]  CARE TEAM:  PCP: Patient, No Pcp Per  Outpatient Care Team: Patient Care Team: Patient, No Pcp Per as PCP - General (General Practice)  Inpatient Treatment Team: Treatment Team: Attending Provider: Wynetta Fines, MD; Registered Nurse: Jolene Schimke, RN; Physician Assistant: Placido Sou, PA-C; Technician: Timoteo Ace, NT; Registered Nurse: Erenest Rasher, RN; Consulting Physician: Montez Morita, Md, MD   This patient is a 19 y.o.male who presents today for surgical evaluation at the request of Placido Sou, PA at [facility] ED.   Chief complaint / Reason for evaluation: Abdominal pain and probable appendicitis  Young male who is had some mild intermittent abdominal pain for the past 2 years but nothing lasting more than an hour or 2.  However he had an episode of pain and discomfort that woke him up around 545 this morning.  Decreased appetite and nausea.  Persisted.  Became more intense.  Seem more focused in the mid abdomen but now is more bothersome in the right lower side.  Based on concerns he came to the emergency room.  Exam and CT scan suspicious for appendicitis.  Surgical consultation requested.  Patient does not smoke tobacco but intermittently dips.  No diabetes.  No airway or other breathing issues.  No prior abdominal surgery.  Usually moves his bowels most days.  No personal nor family history of GI/colon cancer, inflammatory bowel disease, irritable bowel syndrome, allergy such as Celiac Sprue, dietary/dairy problems, colitis, ulcers nor gastritis.  No recent sick contacts/gastroenteritis.  No travel outside the country.  No changes in diet.  No dysphagia to solids or liquids.  No significant heartburn or reflux.  No hematochezia, hematemesis, coffee ground emesis.  No evidence of prior gastric/peptic ulceration.    Assessment  Charels Stambaugh Dyer  19 y.o. male       Problem List:  Principal Problem:   Acute  appendicitis Active Problems:   Laryngopharyngeal reflux (LPR)   Early acute appendicitis  Plan:  I have antibiotics  IV fluids.  Nausea and pain control.  Diagnostic laparoscopy with probable appendectomy. The anatomy & physiology of the digestive tract was discussed.  The pathophysiology of appendicitis and other appendiceal disorders were discussed.  Natural history risks without surgery was discussed.   I feel the risks of no intervention will lead to serious problems that outweigh the operative risks; therefore, I recommended diagnostic laparoscopy with removal of appendix to remove the pathology.  Laparoscopic & open techniques were discussed.   I noted a good likelihood this will help address the problem.   Risks such as bleeding, infection, abscess, leak, reoperation, injury to other organs, need for repair of tissues / organs, possible ostomy, hernia, heart attack, stroke, death, and other risks were discussed.  Goals of post-operative recovery were discussed as well.  We will work to minimize complications.  Questions were answered.  The patient expresses understanding & wishes to proceed with surgery.   -VTE prophylaxis- SCDs, etc -mobilize as tolerated to help recovery  25 minutes spent in review, evaluation, examination, counseling, and coordination of care.  More than 50% of that time was spent in counseling.  Ardeth Sportsman, MD, FACS, MASCRS Gastrointestinal and Minimally Invasive Surgery  [facility] [address] [place], [place] [address] [phone] Fax [phone] Main/Paging  CONTACT INFORMATION: Weekday (9AM-5PM) concerns: Call [facility] main office at [phone] Weeknight (5PM-9AM) or Weekend/Holiday concerns: Check [email or web address] for General Surgery [facility] coverage (  Please, do not use SecureChat as it is not reliable communication to operating surgeons for immediate patient care)      07/31/2020      History reviewed.  No pertinent past medical history.  No past surgical history on file.  Social History   Socioeconomic History  . Marital status: Single    Spouse name: Not on file  . Number of children: Not on file  . Years of education: Not on file  . Highest education level: Not on file  Occupational History  . Not on file  Tobacco Use  . Smoking status: Never Smoker  . Smokeless tobacco: Never Used  Substance and Sexual Activity  . Alcohol use: Yes  . Drug use: Never  . Sexual activity: Not on file  Other Topics Concern  . Not on file  Social History Narrative  . Not on file   Social Determinants of Health   Financial Resource Strain:   . Difficulty of Paying Living Expenses: Not on file  Food Insecurity:   . Worried About Programme researcher, broadcasting/film/video in the Last Year: Not on file  . Ran Out of Food in the Last Year: Not on file  Transportation Needs:   . Lack of Transportation (Medical): Not on file  . Lack of Transportation (Non-Medical): Not on file  Physical Activity:   . Days of Exercise per Week: Not on file  . Minutes of Exercise per Session: Not on file  Stress:   . Feeling of Stress : Not on file  Social Connections:   . Frequency of Communication with Friends and Family: Not on file  . Frequency of Social Gatherings with Friends and Family: Not on file  . Attends Religious Services: Not on file  . Active Member of Clubs or Organizations: Not on file  . Attends Banker Meetings: Not on file  . Marital Status: Not on file  Intimate Partner Violence:   . Fear of Current or Ex-Partner: Not on file  . Emotionally Abused: Not on file  . Physically Abused: Not on file  . Sexually Abused: Not on file    Family History  Problem Relation Age of Onset  . Healthy Mother   . Healthy Father     Current Facility-Administered Medications  Medication Dose Route Frequency Provider Last Rate Last Admin  . [START ON 08/01/2020] acetaminophen (TYLENOL) tablet 1,000 mg  1,000  mg Oral On Call to OR Karie Soda, MD      . bupivacaine liposome (EXPAREL) 1.3 % injection 266 mg  20 mL Infiltration Once Karie Soda, MD      . Melene Muller ON 08/01/2020] cefoTEtan (CEFOTAN) 2 g in sodium chloride 0.9 % 100 mL IVPB  2 g Intravenous On Call to OR Karie Soda, MD      . cefTRIAXone (ROCEPHIN) 2 g in sodium chloride 0.9 % 100 mL IVPB  2 g Intravenous Once Placido Sou, PA-C 200 mL/hr at 07/31/20 1428 2 g at 07/31/20 1428   And  . metroNIDAZOLE (FLAGYL) IVPB 500 mg  500 mg Intravenous Once Placido Sou, PA-C      . [START ON 08/01/2020] celecoxib (CELEBREX) capsule 200 mg  200 mg Oral On Call to OR Karie Soda, MD      . Chlorhexidine Gluconate Cloth 2 % PADS 6 each  6 each Topical Once Karie Soda, MD       And  . Chlorhexidine Gluconate Cloth 2 % PADS 6 each  6 each Topical Once  Karie Soda, MD      . Melene Muller ON 08/01/2020] gabapentin (NEURONTIN) capsule 300 mg  300 mg Oral On Call to OR Karie Soda, MD       Current Outpatient Medications  Medication Sig Dispense Refill  . bismuth subsalicylate (PEPTO BISMOL) 262 MG chewable tablet Chew 524 mg by mouth as needed for indigestion.    Marland Kitchen ibuprofen (ADVIL) 200 MG tablet Take 200-400 mg by mouth every 6 (six) hours as needed for fever, headache or mild pain.    . Multiple Vitamin (MULTIVITAMIN) tablet Take 1 tablet by mouth daily.    . Probiotic Product (PROBIOTIC PO) Take 1 capsule by mouth daily.    . hydrOXYzine (ATARAX/VISTARIL) 25 MG tablet Take 0.5-1 tablets (12.5-25 mg total) by mouth every 8 (eight) hours as needed for itching. (Patient not taking: Reported on 07/31/2020) 30 tablet 0     No Known Allergies  ROS:   All other systems reviewed & are negative except per HPI or as noted below: Constitutional:  No fevers, chills, sweats.  Weight stable Eyes:  No vision changes, No discharge HENT:  No sore throats, nasal drainage Lymph: No neck swelling, No bruising easily Pulmonary:  No cough, productive  sputum CV: No orthopnea, PND  Patient walks 60 minutes for about 2 miles without difficulty.  No exertional chest/neck/shoulder/arm pain. GI:  No personal nor family history of GI/colon cancer, inflammatory bowel disease, irritable bowel syndrome, allergy such as Celiac Sprue, dietary/dairy problems, colitis, ulcers nor gastritis.  No recent sick contacts/gastroenteritis.  No travel outside the country.  No changes in diet. Renal: No UTIs, No hematuria Genital:  No drainage, bleeding, masses Musculoskeletal: No severe joint pain.  Good ROM major joints Skin:  No sores or lesions.  No rashes Heme/Lymph:  No easy bleeding.  No swollen lymph nodes Neuro: No focal weakness/numbness.  No seizures Psych: No suicidal ideation.  No hallucinations  BP 134/81   Pulse 89   Temp 98 F (36.7 C) (Oral)   Resp 16   SpO2 100%   Physical Exam: Constitutional: Not cachectic.  Hygeine adequate.  Vitals signs as above.   Eyes: Pupils reactive, normal extraocular movements. Sclera nonicteric Neuro: CN II-XII intact.  No major focal sensory defects.  No major motor deficits. Lymph: No head/neck/groin lymphadenopathy Psych:  No severe agitation.  No severe anxiety.  Judgment & insight Adequate, Oriented x4, HENT: Normocephalic, Mucus membranes moist.  No thrush.   Neck: Supple, No tracheal deviation.  No obvious thyromegaly Chest: No pain to chest wall compression.  Good respiratory excursion.  No audible wheezing CV:  Pulses intact.   Regular rhythm.  No major extremity edema Abdomen:  Soft.  Mildly distended.  Tenderness at RLQ.  Some pain to deep palpation with some rebound tenderness.  No tenderness on left lower quadrant.. No incarcerated hernias.  No hepatomegaly.  No splenomegaly Gen:  No inguinal hernias.  No inguinal lymphadenopathy.   Ext: No obvious deformity or contracture no significant edema.  No cyanosis Skin: No major subcutaneous nodules.  Warm and dry Musculoskeletal: Severe joint rigidity  not present.  No obvious clubbing.  No digital petechiae.     Results:   Labs: Results for orders placed or performed during the hospital encounter of 07/31/20 (from the past 48 hour(s))  Urinalysis, Routine w reflex microscopic     Status: Abnormal   Collection Time: 07/31/20 11:21 AM  Result Value Ref Range   Color, Urine YELLOW YELLOW   APPearance CLEAR  CLEAR   Specific Gravity, Urine 1.021 1.005 - 1.030   pH 5.0 5.0 - 8.0   Glucose, UA NEGATIVE NEGATIVE mg/dL   Hgb urine dipstick NEGATIVE NEGATIVE   Bilirubin Urine NEGATIVE NEGATIVE   Ketones, ur 80 (A) NEGATIVE mg/dL   Protein, ur NEGATIVE NEGATIVE mg/dL   Nitrite NEGATIVE NEGATIVE   Leukocytes,Ua NEGATIVE NEGATIVE    Comment: Performed at [facility], [address] [place], [place] [address]  Comprehensive metabolic panel     Status: Abnormal   Collection Time: 07/31/20 12:30 PM  Result Value Ref Range   Sodium 141 135 - 145 mmol/L   Potassium 4.6 3.5 - 5.1 mmol/L   Chloride 105 98 - 111 mmol/L   CO2 26 22 - 32 mmol/L   Glucose, Bld 97 70 - 99 mg/dL    Comment: Glucose reference range applies only to samples taken after fasting for at least 8 hours.   BUN 18 6 - 20 mg/dL   Creatinine, Ser 1.51 0.61 - 1.24 mg/dL   Calcium 9.8 8.9 - 76.1 mg/dL   Total Protein 8.4 (H) 6.5 - 8.1 g/dL   Albumin 5.5 (H) 3.5 - 5.0 g/dL   AST 25 15 - 41 U/L   ALT 34 0 - 44 U/L   Alkaline Phosphatase 75 38 - 126 U/L   Total Bilirubin 1.3 (H) 0.3 - 1.2 mg/dL   GFR, Estimated >60 >73 mL/min    Comment: (NOTE) Calculated using the CKD-EPI Creatinine Equation (2021)    Anion gap 10 5 - 15    Comment: Performed at [facility], [address] [place], [place] [address]  Lipase, blood     Status: None   Collection Time: 07/31/20 12:30 PM  Result Value Ref Range   Lipase 24 11 - 51 U/L    Comment: Performed at [facility], [address], [place], [place] [address]  CBC with  Differential     Status: Abnormal   Collection Time: 07/31/20 12:30 PM  Result Value Ref Range   WBC 16.4 (H) 4.0 - 10.5 K/uL   RBC 5.32 4.22 - 5.81 MIL/uL   Hemoglobin 16.6 13.0 - 17.0 g/dL   HCT 46.2 39 - 52 %   MCV 91.4 80.0 - 100.0 fL   MCH 31.2 26.0 - 34.0 pg   MCHC 34.2 30.0 - 36.0 g/dL   RDW 70.3 50.0 - 93.8 %   Platelets 341 150 - 400 K/uL   nRBC 0.0 0.0 - 0.2 %   Neutrophils Relative % 91 %   Neutro Abs 14.9 (H) 1.7 - 7.7 K/uL   Lymphocytes Relative 4 %   Lymphs Abs 0.6 (L) 0.7 - 4.0 K/uL   Monocytes Relative 5 %   Monocytes Absolute 0.8 0.1 - 1.0 K/uL   Eosinophils Relative 0 %   Eosinophils Absolute 0.0 0.0 - 0.5 K/uL   Basophils Relative 0 %   Basophils Absolute 0.1 0.0 - 0.1 K/uL   Immature Granulocytes 0 %   Abs Immature Granulocytes 0.07 0.00 - 0.07 K/uL    Comment: Performed at [facility], [address] [place], [place] [address]    Imaging / Studies: CT ABDOMEN PELVIS W CONTRAST  Result Date: 07/31/2020 CLINICAL DATA:  Right lower quadrant pain. EXAM: CT ABDOMEN AND PELVIS WITH CONTRAST TECHNIQUE: Multidetector CT imaging of the abdomen and pelvis was performed using the standard protocol following bolus administration of intravenous contrast. CONTRAST:  OMNIPAQUE  IOHEXOL 300 MG/ML  SOLN COMPARISON:  None. FINDINGS: Lower chest: No acute abnormality. Hepatobiliary: No focal liver abnormality is seen. No gallstones, gallbladder wall thickening, or biliary dilatation. Pancreas: Unremarkable. No pancreatic ductal dilatation or surrounding inflammatory changes. Spleen: Normal in size without focal abnormality. Adrenals/Urinary Tract: Adrenal glands are unremarkable. There are innumerable right renal cysts, majority are subcentimeter, largest in the midpole measures 3.2 cm. No suspicious solid lesion. Normal appearance of the left kidney. No hydronephrosis or renal calculi. Unremarkable urinary bladder. No Stomach/Bowel: Stomach is within normal  limits. The appendix is diffusely dilated measuring 1.2 cm in diameter with periappendiceal fat stranding. There is no evidence of free air or adjacent fluid collection. No evidence of bowel wall thickening, distention, or inflammatory changes. Vascular/Lymphatic: No significant vascular findings are present. No enlarged abdominal or pelvic lymph nodes. Reproductive: Prostate is unremarkable. Other: Trace free fluid in the pelvis.  No abdominal wall hernia. Musculoskeletal: No acute or significant osseous findings. IMPRESSION: 1. Acute uncomplicated appendicitis. 2. Innumerable right renal cysts, majority of which are subcentimeter, largest in the midpole measures 3.2 cm. Consider follow-up renal ultrasound in 6 months. Electronically Signed   By: Emmaline KluverNancy  Ballantyne M.D.   On: 07/31/2020 14:01    Medications / Allergies: per chart  Antibiotics: Anti-infectives (From admission, onward)   Start     Dose/Rate Route Frequency Ordered Stop   08/01/20 0600  cefoTEtan (CEFOTAN) 2 g in sodium chloride 0.9 % 100 mL IVPB        2 g 200 mL/hr over 30 Minutes Intravenous On call to O.R. 07/31/20 1433 08/02/20 0559   07/31/20 1415  cefTRIAXone (ROCEPHIN) 2 g in sodium chloride 0.9 % 100 mL IVPB       "And" Linked Group Details   2 g 200 mL/hr over 30 Minutes Intravenous  Once 07/31/20 1408     07/31/20 1415  metroNIDAZOLE (FLAGYL) IVPB 500 mg       "And" Linked Group Details   500 mg 100 mL/hr over 60 Minutes Intravenous  Once 07/31/20 1408          Note: Portions of this report may have been transcribed using voice recognition software. Every effort was made to ensure accuracy; however, inadvertent computerized transcription errors may be present.   Any transcriptional errors that result from this process are unintentional.    Ardeth SportsmanSteven C. Ranyia Witting, MD, FACS, MASCRS Gastrointestinal and Minimally Invasive Surgery  [facility] [address] [place], [place] [address] [phone]  [phone] Fax [phone] Main/Paging  CONTACT INFORMATION: Weekday (9AM-5PM) concerns: Call [facility] main office at [phone] Weeknight (5PM-9AM) or Weekend/Holiday concerns: Check [email or web address] for General Surgery [facility] coverage (Please, do not use SecureChat as it is not reliable communication to operating surgeons for immediate patient care)      07/31/2020  2:33 PM

## 2020-07-31 NOTE — Anesthesia Postprocedure Evaluation (Signed)
Anesthesia Post Note  Patient: Nicholas Dyer  Procedure(s) Performed: APPENDECTOMY LAPAROSCOPIC (N/A Abdomen)     Patient location during evaluation: PACU Anesthesia Type: General Level of consciousness: awake and alert Pain management: pain level controlled Vital Signs Assessment: post-procedure vital signs reviewed and stable Respiratory status: spontaneous breathing, nonlabored ventilation, respiratory function stable and patient connected to nasal cannula oxygen Cardiovascular status: blood pressure returned to baseline and stable Postop Assessment: no apparent nausea or vomiting Anesthetic complications: no   No complications documented.  Last Vitals:  Vitals:   07/31/20 1930 07/31/20 1945  BP: 130/81 132/79  Pulse: 96 93  Resp:  19  Temp:  37 C  SpO2: 97% 94%    Last Pain:  Vitals:   07/31/20 1945  TempSrc:   PainSc: 5                  Breuna Loveall DAVID

## 2020-07-31 NOTE — ED Notes (Signed)
Consent form @ bedside

## 2020-07-31 NOTE — Anesthesia Preprocedure Evaluation (Signed)
Anesthesia Evaluation  Patient identified by MRN, date of birth, ID band Patient awake    Reviewed: Allergy & Precautions, NPO status , Patient's Chart, lab work & pertinent test results  Airway Mallampati: I  TM Distance: >3 FB Neck ROM: Full    Dental   Pulmonary    Pulmonary exam normal        Cardiovascular Normal cardiovascular exam     Neuro/Psych    GI/Hepatic   Endo/Other    Renal/GU      Musculoskeletal   Abdominal   Peds  Hematology   Anesthesia Other Findings   Reproductive/Obstetrics                             Anesthesia Physical Anesthesia Plan  ASA: II and emergent  Anesthesia Plan: General   Post-op Pain Management:    Induction: Intravenous  PONV Risk Score and Plan: 2 and Ondansetron and Midazolam  Airway Management Planned: Oral ETT  Additional Equipment:   Intra-op Plan:   Post-operative Plan: Extubation in OR  Informed Consent: I have reviewed the patients History and Physical, chart, labs and discussed the procedure including the risks, benefits and alternatives for the proposed anesthesia with the patient or authorized representative who has indicated his/her understanding and acceptance.       Plan Discussed with: CRNA and Surgeon  Anesthesia Plan Comments:         Anesthesia Quick Evaluation

## 2020-07-31 NOTE — Op Note (Addendum)
PATIENT:  Nicholas Dyer  19 y.o. male  Patient Care Team: Patient, No Pcp Per as PCP - General (General Practice)  PRE-OPERATIVE DIAGNOSIS:  Appendicitis  POST-OPERATIVE DIAGNOSIS:  Acute phlegmonous appendicitis  PROCEDURE:   APPENDECTOMY LAPAROSCOPIC TRANSVERSUS ABDOMINIS PLANE (TAP) BLOCK - BILATERAL  SURGEON:  Ardeth Sportsman, MD  ANESTHESIA:   local and general  Nerve block provided with liposomal bupivacaine (Experel) mixed with 0.25% bupivacaine as a Bilateral TAP block x 44mL each side at the level of the transverse abdominis & preperitoneal spaces along the flank at the anterior axillary line, from subcostal ridge to iliac crest under laparoscopic guidance    EBL:  Total I/O In: 100 [IV Piggyback:100] Out: 510 [Urine:500; Blood:10]  Delay start of Pharmacological VTE agent (>24hrs) due to surgical blood loss or risk of bleeding:  no  DRAINS: none   SPECIMEN:  APPENDIX  DISPOSITION OF SPECIMEN:  PATHOLOGY  COUNTS:  YES  PLAN OF CARE: Admit for overnight observation  PATIENT DISPOSITION:  PACU - hemodynamically stable.   INDICATIONS: Patient with concerning symptoms & work up suspicious for appendicitis.  Surgery was recommended:  The anatomy & physiology of the digestive tract was discussed.  The pathophysiology of appendicitis was discussed.  Natural history risks without surgery was discussed.   I feel the risks of no intervention will lead to serious problems that outweigh the operative risks; therefore, I recommended diagnostic laparoscopy with removal of appendix to remove the pathology.  Laparoscopic & open techniques were discussed.   I noted a good likelihood this will help address the problem.    Risks such as bleeding, infection, abscess, leak, reoperation, possible ostomy, hernia, heart attack, death, and other risks were discussed.  Goals of post-operative recovery were discussed as well.  We will work to minimize complications.  Questions were  answered.  The patient expresses understanding & wishes to proceed with surgery.  OR FINDINGS: Thickened appendix with phlegmon and localized peritonitis.  Possible early perforation but not definite.  No gangrene.  No discrete abscess  CASE DATA:  Type of patient?: LDOW CASE (Surgical Hospitalist [facility] Inpatient)  Status of Case? URGENT Add On  Infection Present At Time Of Surgery (PATOS)?  PHLEGMON  DESCRIPTION:   The patient was identified & brought into the operating room. The patient was positioned supine with arms tucked. SCDs were active during the entire case. The patient underwent general anesthesia without any difficulty.  The abdomen was prepped and draped in a sterile fashion. A Surgical Timeout confirmed our plan.  I made a transverse incision through the superior umbilical fold.  I made a small transverse nick through the infraumbilical fascia and confirmed peritoneal entry.  I placed a 50mm port.  We induced carbon dioxide insufflation.  Camera inspection revealed no injury.  I placed additional ports under direct laparoscopic visualization.  I mobilized the terminal ileum to proximal ascending colon in a lateral to medial fashion.  I took care to avoid injuring any retroperitoneal structures.  I freed the appendix off its attachments to the ascending colon and cecal mesentery.  I elevated the appendix. I skeletonized the mesoappendix. I was able to free off the base of the appendix which was still viable.  I dissected and transected through the mesoappendix and assured hemostasis in the mesentery. I stapled the appendix off the cecum using a laparoscopic stapler. I took a healthy cuff of viable cecum  I placed the appendix inside an EcoSac bag and removed out the 36mm  stapler port.  I did copious irrigation. Hemostasis was good in the mesoappendix, colon mesentery, and retroperitoneum. Staple line was intact on the cecum with no bleeding. I washed out the pelvis, retrohepatic space  and right paracolic gutter. I washed out the left side as well.  Hemostasis is good. There was no perforation or injury. Because the area cleaned up well after irrigation, I did not place a drain.  I closed the 12 mm stapler port site with a 0 Vicryl stitch using a suture passer under direct laparoscopic visualization.   I aspirated the carbon dioxide. I removed the ports.  I closed skin using 4-0 monocryl stitch.  Sterile dressings applied.  Patient was extubated and sent to the recovery room.  I suspect the patient is going used in the hospital at least overnight and will need antibiotics for 4 days. I made an attempt to locate family to discuss patient's status and recommendations.  Tried to reach his mother per his request.  No one is available at this time.  We will monitor him at least overnight and instructions are written. Ardeth Sportsman, M.D., F.A.C.S. Gastrointestinal and Minimally Invasive Surgery [facility] [address] [place], [place] [address] [phone] Main / Paging  07/31/2020 6:24 PM

## 2020-07-31 NOTE — Progress Notes (Signed)
I discussed operative findings, updated the patient's status, discussed probable steps to recovery, and gave postoperative recommendations to the patient's father.  Recommendations were made.  Questions were answered.  He expressed understanding & appreciation.  Ardeth Sportsman, MD, FACS, MASCRS Gastrointestinal and Minimally Invasive Surgery  [facility] [address] [place], [place] [address] [phone] Fax [phone] Main/Paging  CONTACT INFORMATION: Weekday (9AM-5PM) concerns: Call [facility] main office at [phone] Weeknight (5PM-9AM) or Weekend/Holiday concerns: Check [email or web address] for General Surgery [facility] coverage (Please, do not use SecureChat as it is not reliable communication to operating surgeons for immediate patient care)

## 2020-07-31 NOTE — Transfer of Care (Signed)
Immediate Anesthesia Transfer of Care Note  Patient: Nicholas Dyer  Procedure(s) Performed: APPENDECTOMY LAPAROSCOPIC (N/A Abdomen)  Patient Location: PACU  Anesthesia Type:General  Level of Consciousness: awake, alert , oriented, drowsy and patient cooperative  Airway & Oxygen Therapy: Patient Spontanous Breathing and Patient connected to face mask oxygen  Post-op Assessment: Report given to RN and Post -op Vital signs reviewed and stable  Post vital signs: Reviewed and stable  Last Vitals:  Vitals Value Taken Time  BP 143/84 07/31/20 1833  Temp    Pulse 102 07/31/20 1836  Resp 20 07/31/20 1836  SpO2 100 % 07/31/20 1836  Vitals shown include unvalidated device data.  Last Pain:  Vitals:   07/31/20 1432  TempSrc:   PainSc: 6          Complications: No complications documented.

## 2020-07-31 NOTE — ED Triage Notes (Signed)
Pt arrived via walk in, c/o URQ and ULQ abd pain, and n/v since 5am this morning. Denies any diarrhea or any urinary sx.

## 2020-07-31 NOTE — ED Notes (Signed)
Mom bedside

## 2020-07-31 NOTE — ED Provider Notes (Signed)
[facility]-EMERG[facility] DEPT Provider Note   CSN: [id number] Arrival date & time: 07/31/20  1055     History Chief Complaint  Patient presents with  . Abdominal Pain    Draco Malczewski is a 19 y.o. male.  HPI Patient is a 19 year old male who presents to the emergency department due to abdominal pain.  Patient states he woke up with his symptoms about 6 hours ago.  Notes the pain is diffuse in the upper abdomen.  Reports intractable nausea and vomiting.  States that it is bilious but nonbloody.  Denies any recent alcohol or drug use.  States that he chews tobacco intermittently and did last night but otherwise no recent substance use.  He went to urgent care prior to arrival and had a negative COVID-19 test and they recommended he come to the emergency department for further work-up.  Patient is not on any regular medications.  He is not anticoagulated.  Denies fevers, chills, chest pain, shortness of breath, diarrhea, urinary changes.    No past medical history on file.  There are no problems to display for this patient.   No past surgical history on file.     Family History  Problem Relation Age of Onset  . Healthy Mother   . Healthy Father     Social History   Tobacco Use  . Smoking status: Never Smoker  . Smokeless tobacco: Never Used  Substance Use Topics  . Alcohol use: Yes  . Drug use: Never    Home Medications Prior to Admission medications   Medication Sig Start Date End Date Taking? Authorizing Provider  hydrOXYzine (ATARAX/VISTARIL) 25 MG tablet Take 0.5-1 tablets (12.5-25 mg total) by mouth every 8 (eight) hours as needed for itching. 05/27/20   Wallis Bamberg, PA-C  Multiple Vitamin (MULTIVITAMIN) tablet Take 1 tablet by mouth daily.    [provider]    Allergies    Patient has no known allergies.  Review of Systems   Review of Systems  All other systems reviewed and are negative. Ten systems reviewed and are negative  for acute change, except as noted in the HPI.   Physical Exam Updated Vital Signs BP (!) 155/105 (BP Location: Left Arm)   Pulse 83   Temp 98 F (36.7 C) (Oral)   Resp 16   SpO2 100%   Physical Exam Vitals and nursing note reviewed.  Constitutional:      General: He is not in acute distress.    Appearance: Normal appearance. He is well-developed. He is not ill-appearing, toxic-appearing or diaphoretic.  HENT:     Head: Normocephalic and atraumatic.     Right Ear: External ear normal.     Left Ear: External ear normal.     Nose: Nose normal.     Mouth/Throat:     Mouth: Mucous membranes are moist.     Pharynx: Oropharynx is clear. No oropharyngeal exudate or posterior oropharyngeal erythema.  Eyes:     Extraocular Movements: Extraocular movements intact.  Cardiovascular:     Rate and Rhythm: Normal rate and regular rhythm.     Pulses: Normal pulses.     Heart sounds: Normal heart sounds. No murmur heard.  No friction rub. No gallop.      Comments: Regular rate and rhythm without murmurs, rubs, gallops. Pulmonary:     Effort: Pulmonary effort is normal. No respiratory distress.     Breath sounds: Normal breath sounds. No stridor. No wheezing, rhonchi or rales.  Comments: Lungs are clear to auscultation bilaterally. Abdominal:     General: Abdomen is flat and protuberant.     Palpations: Abdomen is soft.     Tenderness: There is abdominal tenderness. Positive signs include McBurney's sign. Negative signs include Murphy's sign.     Comments: Abdomen is soft.  Mild diffuse tenderness noted along the upper abdomen.  Moderate tenderness noted along the right lower quadrant.  Musculoskeletal:        General: Normal range of motion.     Cervical back: Normal range of motion and neck supple. No tenderness.  Skin:    General: Skin is warm and dry.  Neurological:     General: No focal deficit present.     Mental Status: He is alert and oriented to person, place, and time.    Psychiatric:        Mood and Affect: Mood normal.        Behavior: Behavior normal.    ED Results / Procedures / Treatments   Labs (all labs ordered are listed, but only abnormal results are displayed) Labs Reviewed  COMPREHENSIVE METABOLIC PANEL - Abnormal; Notable for the following components:      Result Value   Total Protein 8.4 (*)    Albumin 5.5 (*)    Total Bilirubin 1.3 (*)    All other components within normal limits  CBC WITH DIFFERENTIAL/PLATELET - Abnormal; Notable for the following components:   WBC 16.4 (*)    Neutro Abs 14.9 (*)    Lymphs Abs 0.6 (*)    All other components within normal limits  URINALYSIS, ROUTINE W REFLEX MICROSCOPIC - Abnormal; Notable for the following components:   Ketones, ur 80 (*)    All other components within normal limits  RESPIRATORY PANEL BY RT PCR (FLU A&B, COVID)  LIPASE, BLOOD   EKG None  Radiology CT ABDOMEN PELVIS W CONTRAST  Result Date: 07/31/2020 CLINICAL DATA:  Right lower quadrant pain. EXAM: CT ABDOMEN AND PELVIS WITH CONTRAST TECHNIQUE: Multidetector CT imaging of the abdomen and pelvis was performed using the standard protocol following bolus administration of intravenous contrast. CONTRAST:  OMNIPAQUE IOHEXOL 300 MG/ML  SOLN COMPARISON:  None. FINDINGS: Lower chest: No acute abnormality. Hepatobiliary: No focal liver abnormality is seen. No gallstones, gallbladder wall thickening, or biliary dilatation. Pancreas: Unremarkable. No pancreatic ductal dilatation or surrounding inflammatory changes. Spleen: Normal in size without focal abnormality. Adrenals/Urinary Tract: Adrenal glands are unremarkable. There are innumerable right renal cysts, majority are subcentimeter, largest in the midpole measures 3.2 cm. No suspicious solid lesion. Normal appearance of the left kidney. No hydronephrosis or renal calculi. Unremarkable urinary bladder. No Stomach/Bowel: Stomach is within normal limits. The appendix is diffusely dilated  measuring 1.2 cm in diameter with periappendiceal fat stranding. There is no evidence of free air or adjacent fluid collection. No evidence of bowel wall thickening, distention, or inflammatory changes. Vascular/Lymphatic: No significant vascular findings are present. No enlarged abdominal or pelvic lymph nodes. Reproductive: Prostate is unremarkable. Other: Trace free fluid in the pelvis.  No abdominal wall hernia. Musculoskeletal: No acute or significant osseous findings. IMPRESSION: 1. Acute uncomplicated appendicitis. 2. Innumerable right renal cysts, majority of which are subcentimeter, largest in the midpole measures 3.2 cm. Consider follow-up renal ultrasound in 6 months. Electronically Signed   By: Emmaline Kluver M.D.   On: 07/31/2020 14:01   Procedures Procedures   Medications Ordered in ED Medications  cefTRIAXone (ROCEPHIN) 2 g in sodium chloride 0.9 % 100 mL  IVPB (2 g Intravenous New Bag/Given 07/31/20 1428)    And  metroNIDAZOLE (FLAGYL) IVPB 500 mg (has no administration in time range)  sodium chloride 0.9 % bolus 1,000 mL (0 mLs Intravenous Stopped 07/31/20 1350)  ondansetron (ZOFRAN) injection 4 mg (4 mg Intravenous Given 07/31/20 1230)  morphine 2 MG/ML injection 2 mg (2 mg Intravenous Given 07/31/20 1231)  iohexol (OMNIPAQUE) 300 MG/ML solution 100 mL (100 mLs Intravenous Contrast Given 07/31/20 1320)   ED Course  I have reviewed the triage vital signs and the nursing notes.  Pertinent labs & imaging results that were available during my care of the patient were reviewed by me and considered in my medical decision making (see chart for details).  Clinical Course as of Jul 31 1428  Sat Jul 31, 2020  1249 WBC(!): 16.4 [LJ]  1249 NEUT#(!): 14.9 [LJ]  1407 1. Acute uncomplicated appendicitis. 2. Innumerable right renal cysts, majority of which are subcentimeter, largest in the midpole measures 3.2 cm. Consider follow-up renal ultrasound in 6 months.  CT ABDOMEN PELVIS W CONTRAST  [LJ]  1415 Patient reassessed and feels that his pain and nausea is well controlled at this time.  Will start on Rocephin as well as Flagyl.  Patient states he has been n.p.o. to solid food since 9 PM last night.  He takes no regular medications.  He is not anticoagulated.   [LJ]    Clinical Course User Index [LJ] Placido Sou, PA-C   MDM Rules/Calculators/A&P                          Patient is a 19 year old male who presents to the emergency department due to intractable nausea and vomiting as well as abdominal pain that started this morning.  Patient has McBurney's point tenderness on my exam.  CT of the abdomen obtained showing an acute uncomplicated appendicitis.  Labs show a leukocytosis of 16.4 with a neutrophilia of 14.9.  Patient given a dose of morphine as well as Zofran and feels that his pain and nausea are well controlled at this time.  He has been n.p.o. to solid food since 9 PM last night.  Patient started on Flagyl and Rocephin.  Patient afebrile and his vital signs are stable.  Patient discussed with Dr. Michaell Cowing with general surgery who will evaluate him at this time.  Note: Portions of this report may have been transcribed using voice recognition software. Every effort was made to ensure accuracy; however, inadvertent computerized transcription errors may be present.   Final Clinical Impression(s) / ED Diagnoses Final diagnoses:  Acute appendicitis, unspecified acute appendicitis type    Rx / DC Orders ED Discharge Orders    None       Placido Sou, PA-C 07/31/20 1429    Wynetta Fines, MD 08/03/20 [phone]

## 2020-08-01 ENCOUNTER — Encounter (HOSPITAL_COMMUNITY): Payer: Self-pay | Admitting: Surgery

## 2020-08-01 DIAGNOSIS — K3580 Unspecified acute appendicitis: Secondary | ICD-10-CM | POA: Diagnosis not present

## 2020-08-01 NOTE — Progress Notes (Signed)
1 Day Post-Op   Subjective/Chief Complaint: No complaints. Feels better   Objective: Vital signs in last 24 hours: Temp:  [97.8 F (36.6 C)-98.6 F (37 C)] 97.8 F (36.6 C) (11/07 0549) Pulse Rate:  [60-108] 60 (11/07 0549) Resp:  [14-21] 17 (11/07 0549) BP: (109-158)/(59-105) 109/61 (11/07 0549) SpO2:  [93 %-100 %] 95 % (11/07 0549) Last BM Date: 07/30/20  Intake/Output from previous day: 11/06 0701 - 11/07 0700 In: 2128.8 [P.O.:720; I.V.:1126.7; IV Piggyback:282.1] Out: 510 [Urine:500; Blood:10] Intake/Output this shift: No intake/output data recorded.  General appearance: alert and cooperative Resp: clear to auscultation bilaterally Cardio: regular rate and rhythm GI: soft, mild tenderness. incisions look good  Lab Results:  Recent Labs    07/31/20 1230  WBC 16.4*  HGB 16.6  HCT 48.6  PLT 341   BMET Recent Labs    07/31/20 1230  NA 141  K 4.6  CL 105  CO2 26  GLUCOSE 97  BUN 18  CREATININE 0.98  CALCIUM 9.8   PT/INR No results for input(s): LABPROT, INR in the last 72 hours. ABG No results for input(s): PHART, HCO3 in the last 72 hours.  Invalid input(s): PCO2, PO2  Studies/Results: CT ABDOMEN PELVIS W CONTRAST  Result Date: 07/31/2020 CLINICAL DATA:  Right lower quadrant pain. EXAM: CT ABDOMEN AND PELVIS WITH CONTRAST TECHNIQUE: Multidetector CT imaging of the abdomen and pelvis was performed using the standard protocol following bolus administration of intravenous contrast. CONTRAST:  OMNIPAQUE IOHEXOL 300 MG/ML  SOLN COMPARISON:  None. FINDINGS: Lower chest: No acute abnormality. Hepatobiliary: No focal liver abnormality is seen. No gallstones, gallbladder wall thickening, or biliary dilatation. Pancreas: Unremarkable. No pancreatic ductal dilatation or surrounding inflammatory changes. Spleen: Normal in size without focal abnormality. Adrenals/Urinary Tract: Adrenal glands are unremarkable. There are innumerable right renal cysts, majority  are subcentimeter, largest in the midpole measures 3.2 cm. No suspicious solid lesion. Normal appearance of the left kidney. No hydronephrosis or renal calculi. Unremarkable urinary bladder. No Stomach/Bowel: Stomach is within normal limits. The appendix is diffusely dilated measuring 1.2 cm in diameter with periappendiceal fat stranding. There is no evidence of free air or adjacent fluid collection. No evidence of bowel wall thickening, distention, or inflammatory changes. Vascular/Lymphatic: No significant vascular findings are present. No enlarged abdominal or pelvic lymph nodes. Reproductive: Prostate is unremarkable. Other: Trace free fluid in the pelvis.  No abdominal wall hernia. Musculoskeletal: No acute or significant osseous findings. IMPRESSION: 1. Acute uncomplicated appendicitis. 2. Innumerable right renal cysts, majority of which are subcentimeter, largest in the midpole measures 3.2 cm. Consider follow-up renal ultrasound in 6 months. Electronically Signed   By: Emmaline Kluver M.D.   On: 07/31/2020 14:01    Anti-infectives: Anti-infectives (From admission, onward)   Start     Dose/Rate Route Frequency Ordered Stop   08/01/20 1400  cefTRIAXone (ROCEPHIN) 2 g in sodium chloride 0.9 % 100 mL IVPB       "And" Linked Group Details   2 g 200 mL/hr over 30 Minutes Intravenous Every 24 hours 07/31/20 1937 08/05/20 1359   07/31/20 2300  metroNIDAZOLE (FLAGYL) IVPB 500 mg       "And" Linked Group Details   500 mg 100 mL/hr over 60 Minutes Intravenous Every 8 hours 07/31/20 1937 08/04/20 2259   07/31/20 1515  cefoTEtan (CEFOTAN) 2 g in sodium chloride 0.9 % 100 mL IVPB        2 g 200 mL/hr over 30 Minutes Intravenous On call to O.R.  07/31/20 1433 07/31/20 1741   07/31/20 1415  cefTRIAXone (ROCEPHIN) 2 g in sodium chloride 0.9 % 100 mL IVPB       "And" Linked Group Details   2 g 200 mL/hr over 30 Minutes Intravenous  Once 07/31/20 1408 07/31/20 1458   07/31/20 1415  metroNIDAZOLE (FLAGYL)  IVPB 500 mg       "And" Linked Group Details   500 mg 100 mL/hr over 60 Minutes Intravenous  Once 07/31/20 1408 07/31/20 1545      Assessment/Plan: s/p Procedure(s): APPENDECTOMY LAPAROSCOPIC (N/A) Advance diet Discharge  LOS: 0 days    Nicholas [facility]yer 08/01/2020

## 2020-08-01 NOTE — Plan of Care (Signed)
All discharge instructions were given to the Pt. All questions were answered. 

## 2020-08-01 NOTE — Plan of Care (Signed)
  Problem: Activity: Goal: Risk for activity intolerance will decrease Outcome: Progressing   

## 2020-08-03 LAB — SURGICAL PATHOLOGY

## 2020-08-06 NOTE — Discharge Summary (Signed)
Physician Discharge Summary  Patient ID: Nicholas Dyer MRN: [id number] DOB/AGE: 03-24-2001 19 y.o.  Admit date: 07/31/2020 Discharge date: 08/06/2020  Admission Diagnoses:  Acute appendicitis History of laryngeal reflux  Discharge Diagnoses:  Acute phlegmonous appendicitis Hx laryngeal reflux    Principal Problem:   Acute phlegmonous appendicitis s/p lap appendectomy 07/31/2020 Active Problems:   Laryngopharyngeal reflux (LPR)   PROCEDURES: Laparoscopic appendectomy with transverse abdominis plane block bilaterally 07/31/2020 Dr. Kalman JewelsSteven Gross  Hospital Course:   Chief complaint / Reason for evaluation: Abdominal pain and probable appendicitis  Young male who is had some mild intermittent abdominal pain for the past 2 years but nothing lasting more than an hour or 2.  However he had an episode of pain and discomfort that woke him up around 545 this morning.  Decreased appetite and nausea.  Persisted.  Became more intense.  Seem more focused in the mid abdomen but now is more bothersome in the right lower side.  Based on concerns he came to the emergency room.  Exam and CT scan suspicious for appendicitis.  Surgical consultation requested.  Patient does not smoke tobacco but intermittently dips.  No diabetes.  No airway or other breathing issues.  No prior abdominal surgery.  Usually moves his bowels most days.  No personal nor family history of GI/colon cancer, inflammatory bowel disease, irritable bowel syndrome, allergy such as Celiac Sprue, dietary/dairy problems, colitis, ulcers nor gastritis.  No recent sick contacts/gastroenteritis.  No travel outside the country.  No changes in diet.  No dysphagia to solids or liquids.  No significant heartburn or reflux.  No hematochezia, hematemesis, coffee ground emesis.  No evidence of prior gastric/peptic ulceration.  Patient was seen in the emergency department by Dr. Michaell CowingGross and taken to the operating room that day.  He underwent the  procedure as described above.  He tolerated procedure well.  He was seen by Dr. Carolynne Edouardoth the following day and discharged home.  I did not see the patient, dictation is from the chart.  Condition on discharge: Improved  CBC Latest Ref Rng & Units 07/31/2020  WBC 4.0 - 10.5 K/uL 16.4(H)  Hemoglobin 13.0 - 17.0 g/dL 40.916.6  Hematocrit 39 - 52 % 48.6  Platelets 150 - 400 K/uL 341   CMP Latest Ref Rng & Units 07/31/2020  Glucose 70 - 99 mg/dL 97  BUN 6 - 20 mg/dL 18  Creatinine 8.110.61 - 9.141.24 mg/dL 7.820.98  Sodium 956135 - 213145 mmol/L 141  Potassium 3.5 - 5.1 mmol/L 4.6  Chloride 98 - 111 mmol/L 105  CO2 22 - 32 mmol/L 26  Calcium 8.9 - 10.3 mg/dL 9.8  Total Protein 6.5 - 8.1 g/dL 0.8(M8.4(H)  Total Bilirubin 0.3 - 1.2 mg/dL 5.7(Q1.3(H)  Alkaline Phos 38 - 126 U/L 75  AST 15 - 41 U/L 25  ALT 0 - 44 U/L 34     Disposition: Discharge disposition: 01-Home or Self Care       Discharge Instructions    Call MD for:   Complete by: As directed    FEVER > 101.5 F  (temperatures < 101.5 F are not significant)   Call MD for:  difficulty breathing, headache or visual disturbances   Complete by: As directed    Call MD for:  extreme fatigue   Complete by: As directed    Call MD for:  extreme fatigue   Complete by: As directed    Call MD for:  hives   Complete by: As directed  Call MD for:  persistant dizziness or light-headedness   Complete by: As directed    Call MD for:  persistant dizziness or light-headedness   Complete by: As directed    Call MD for:  persistant nausea and vomiting   Complete by: As directed    Call MD for:  persistant nausea and vomiting   Complete by: As directed    Call MD for:  redness, tenderness, or signs of infection (pain, swelling, redness, odor or green/yellow discharge around incision site)   Complete by: As directed    Call MD for:  redness, tenderness, or signs of infection (pain, swelling, redness, odor or green/yellow discharge around incision site)   Complete by: As  directed    Call MD for:  severe uncontrolled pain   Complete by: As directed    Call MD for:  severe uncontrolled pain   Complete by: As directed    Call MD for:  temperature >100.4   Complete by: As directed    Diet - low sodium heart healthy   Complete by: As directed    Start with a bland diet such as soups, liquids, starchy foods, low fat foods, etc. the first few days at home. Gradually advance to a solid, low-fat, high fiber diet by the end of the first week at home.   Add a fiber supplement to your diet (Metamucil, etc) If you feel full, bloated, or constipated, stay on a full liquid or pureed/blenderized diet for a few days until you feel better and are no longer constipated.   Diet - low sodium heart healthy   Complete by: As directed    Discharge instructions   Complete by: As directed    See Discharge Instructions If you are not getting better after two weeks or are noticing you are getting worse, contact our office [phone] for further advice.  We may need to adjust your medications, re-evaluate you in the office, send you to the emergency room, or see what other things we can do to help. The clinic staff is available to answer your questions during regular business hours (8:30am-5pm).  Please don't hesitate to call and ask to speak to one of our nurses for clinical concerns.    A surgeon from [facility] is always on call at the hospitals 24 hours/day If you have a medical emergency, go to the nearest emergency room or call 911.   Discharge instructions   Complete by: As directed    May shower. Diet as tolerated. Remove dressings in 1-2 days. Do not lift more than 10lbs for the next 6 weeks   Discharge wound care:   Complete by: As directed    It is good for closed incisions and even open wounds to be washed every day.  Shower every day.  Short baths are fine.  Wash the incisions and wounds clean with soap & water.    You may leave closed incisions open  to air if it is dry.   You may cover the incision with clean gauze & replace it after your daily shower for comfort.  TEGADERM:  You have clear gauze band-aid dressings over your closed incision(s).  Remove the dressings 3 days after surgery.   Driving Restrictions   Complete by: As directed    You may drive when: - you are no longer taking narcotic prescription pain medication - you can comfortably wear a seatbelt - you can safely make sudden turns/stops without pain.  Increase activity slowly   Complete by: As directed    Start light daily activities --- self-care, walking, climbing stairs- beginning the day after surgery.  Gradually increase activities as tolerated.  Control your pain to be active.  Stop when you are tired.  Ideally, walk several times a day, eventually an hour a day.   Most people are back to most day-to-day activities in a few weeks.  It takes 4-6 weeks to get back to unrestricted, intense activity. If you can walk 30 minutes without difficulty, it is safe to try more intense activity such as jogging, treadmill, bicycling, low-impact aerobics, swimming, etc. Save the most intensive and strenuous activity for last (Usually 4-8 weeks after surgery) such as sit-ups, heavy lifting, contact sports, etc.  Refrain from any intense heavy lifting or straining until you are off narcotics for pain control.  You will have off days, but things should improve week-by-week. DO NOT PUSH THROUGH PAIN.  Let pain be your guide: If it hurts to do something, don't do it.   Increase activity slowly   Complete by: As directed    Lifting restrictions   Complete by: As directed    If you can walk 30 minutes without difficulty, it is safe to try more intense activity such as jogging, treadmill, bicycling, low-impact aerobics, swimming, etc. Save the most intensive and strenuous activity for last (Usually 4-8 weeks after surgery) such as sit-ups, heavy lifting, contact sports, etc.   Refrain from  any intense heavy lifting or straining until you are off narcotics for pain control.  You will have off days, but things should improve week-by-week. DO NOT PUSH THROUGH PAIN.  Let pain be your guide: If it hurts to do something, don't do it.  Pain is your body warning you to avoid that activity for another week until the pain goes down.   May shower / Bathe   Complete by: As directed    May walk up steps   Complete by: As directed    No wound care   Complete by: As directed    Remove dressing in 72 hours   Complete by: As directed    Make sure all dressings are removed by the third day after surgery.  Leave incisions open to air.  OK to cover incisions with gauze or bandages as desired   Sexual Activity Restrictions   Complete by: As directed    You may have sexual intercourse when it is comfortable. If it hurts to do something, stop.     Allergies as of 08/01/2020   No Known Allergies     Medication List    TAKE these medications   bismuth subsalicylate 262 MG chewable tablet Commonly known as: PEPTO BISMOL Chew 524 mg by mouth as needed for indigestion.   hydrOXYzine 25 MG tablet Commonly known as: ATARAX/VISTARIL Take 0.5-1 tablets (12.5-25 mg total) by mouth every 8 (eight) hours as needed for itching.   ibuprofen 200 MG tablet Commonly known as: ADVIL Take 200-400 mg by mouth every 6 (six) hours as needed for fever, headache or mild pain.   multivitamin tablet Take 1 tablet by mouth daily.   PROBIOTIC PO Take 1 capsule by mouth daily.   traMADol 50 MG tablet Commonly known as: ULTRAM Take 1-2 tablets (50-100 mg total) by mouth every 6 (six) hours as needed for moderate pain or severe pain.            Discharge Care Instructions  (From admission, onward)  Start     Ordered   07/31/20 0000  Discharge wound care:       Comments: It is good for closed incisions and even open wounds to be washed every day.  Shower every day.  Short baths are fine.  Wash  the incisions and wounds clean with soap & water.    You may leave closed incisions open to air if it is dry.   You may cover the incision with clean gauze & replace it after your daily shower for comfort.  TEGADERM:  You have clear gauze band-aid dressings over your closed incision(s).  Remove the dressings 3 days after surgery.   07/31/20 1818          Follow-up Information    [facility] Schedule an appointment as soon as possible for a visit in 3 weeks.   Specialty: General Surgery Why: To follow up after your operation, To follow up after your hospital stay Contact information: [address] [place] [address] [phone]              Signed: Sherrie George 08/06/2020, 2:39 PM

## 2021-04-30 IMAGING — CT CT ABD-PELV W/ CM
2 of 4 series · 17 of 46 positions shown, 19 images · IV contrast (omnipaque)
Comparison: None.

CLINICAL DATA: Right lower quadrant pain.

EXAM:
CT ABDOMEN AND PELVIS WITH CONTRAST
TECHNIQUE: Multidetector CT imaging of the abdomen and pelvis was performed
using the standard protocol following bolus administration of
intravenous contrast.
CONTRAST:  100mL OMNIPAQUE IOHEXOL 300 MG/ML  SOLN

[Series 2: axial st · axial · 0.81mm/px · z∈[+862,+1287]mm · 14 of 97 slices shown, 16 images]
[im 6/97  soft-tissue]
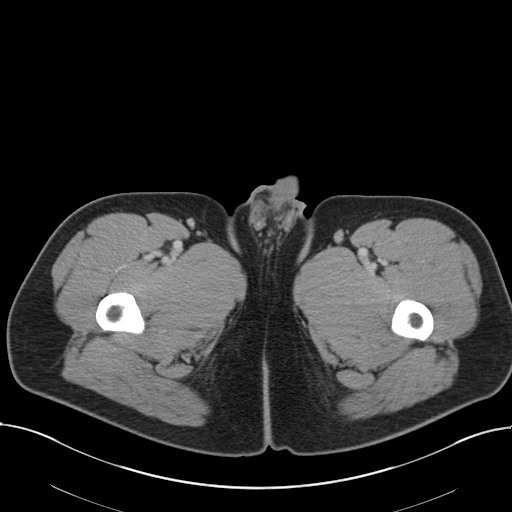
[im 6/97  bone]
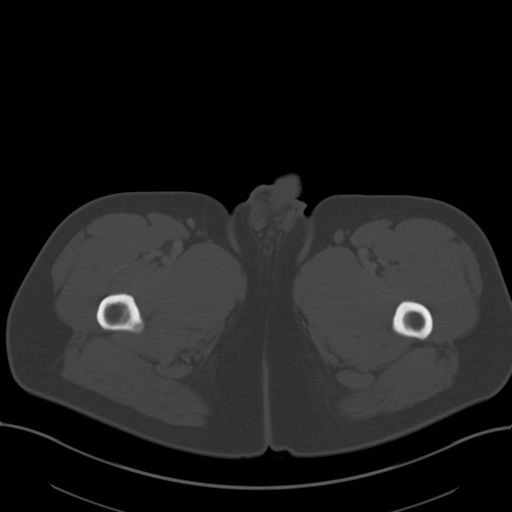
[im 11/97  soft-tissue]
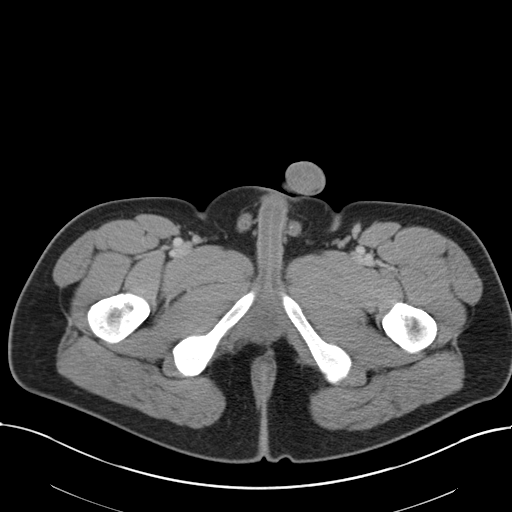
[im 21/97  soft-tissue]
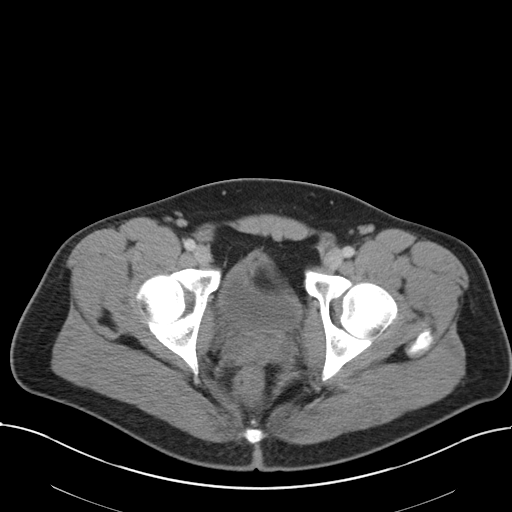
[im 26/97  soft-tissue]
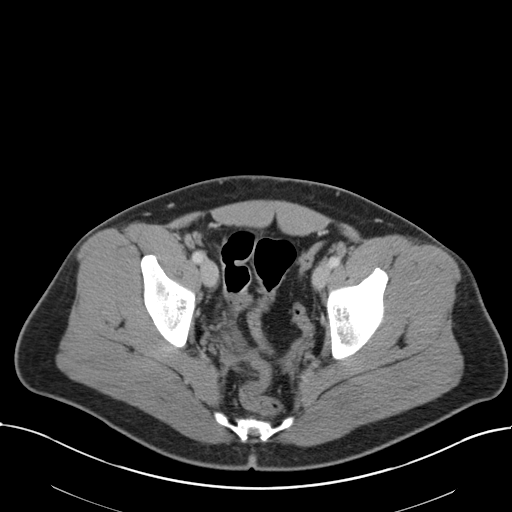
[im 31/97  soft-tissue]
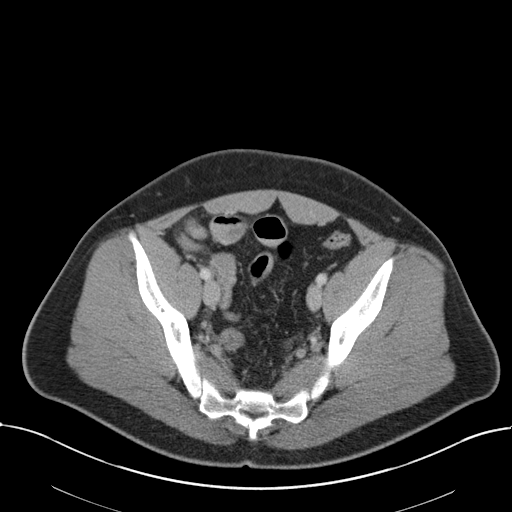
[im 41/97  soft-tissue]
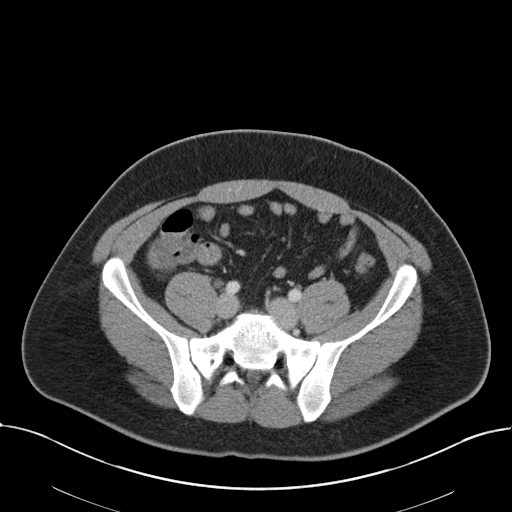
[im 46/97  soft-tissue]
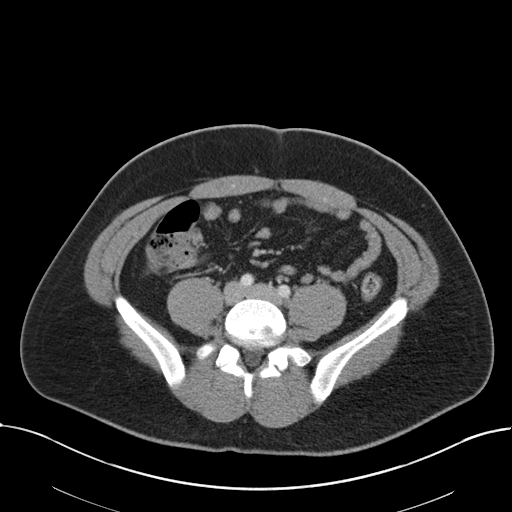
[im 51/97  soft-tissue]
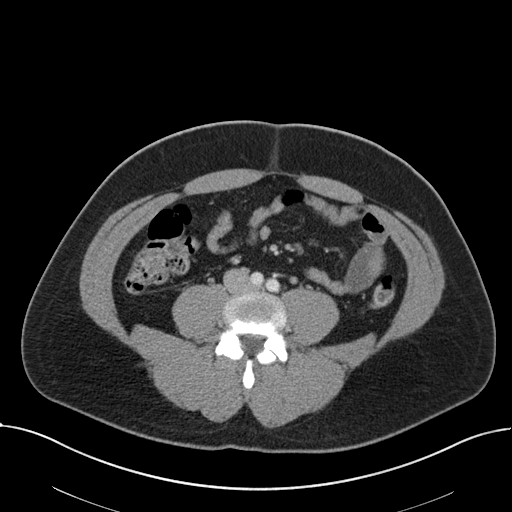
[im 56/97  soft-tissue]
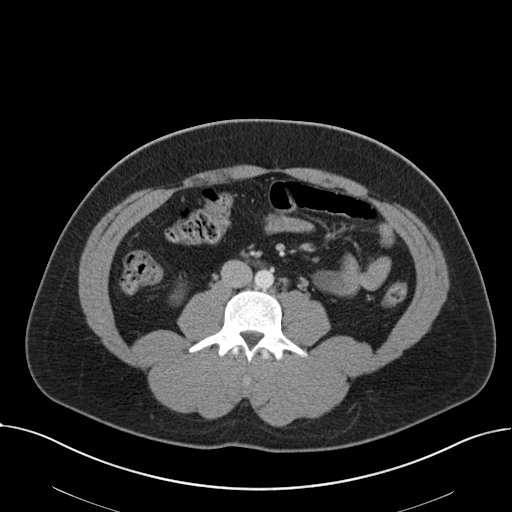
[im 56/97  bone]
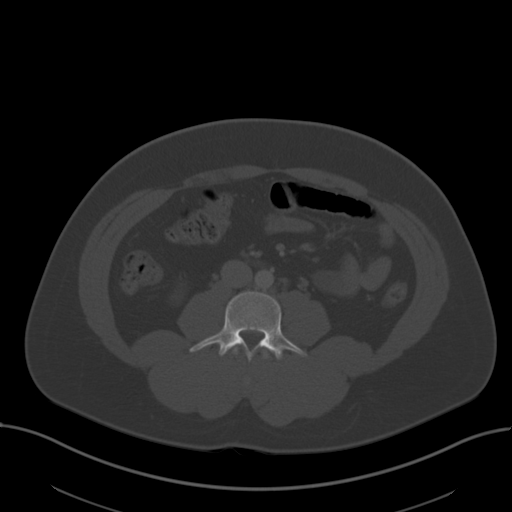
[im 66/97  soft-tissue]
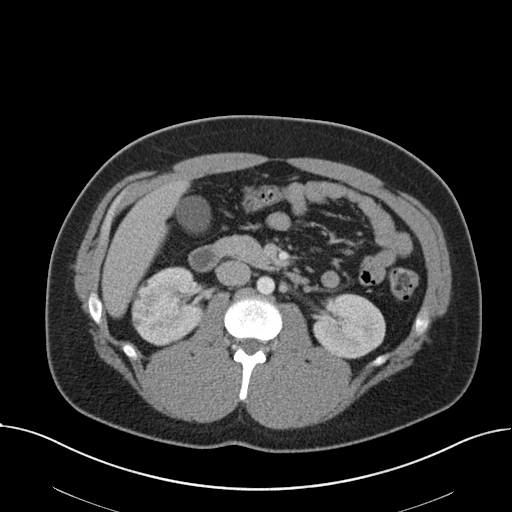
[im 71/97  soft-tissue]
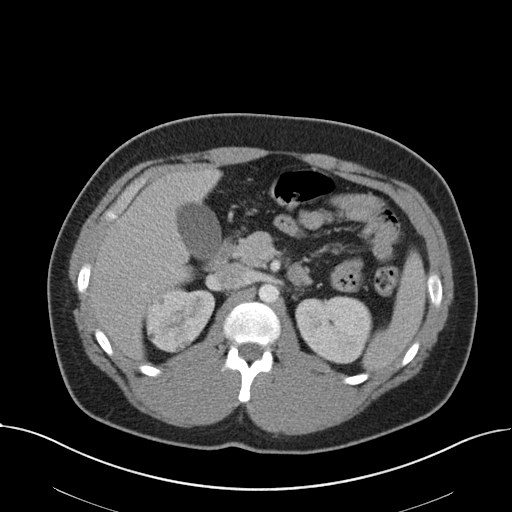
[im 76/97  soft-tissue]
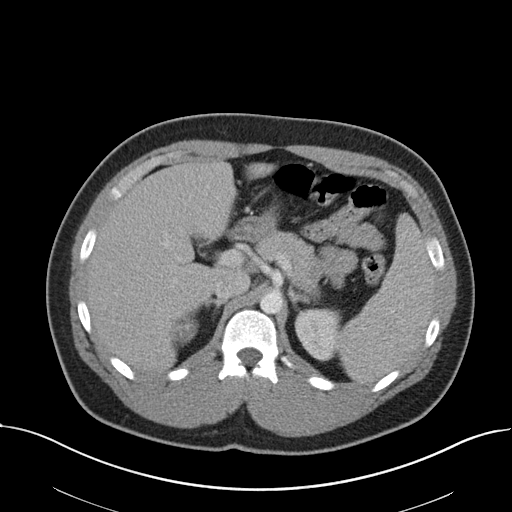
[im 86/97  soft-tissue]
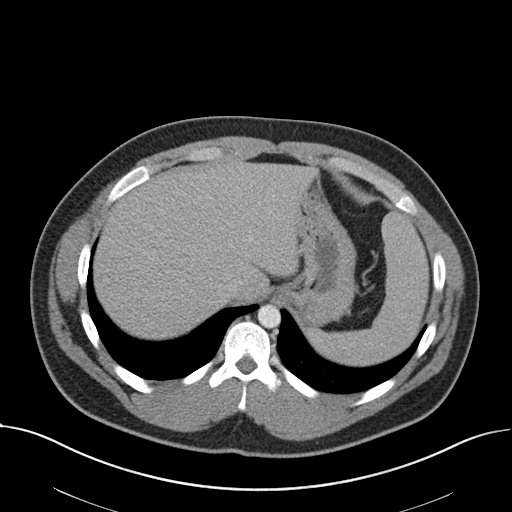
[im 91/97  soft-tissue]
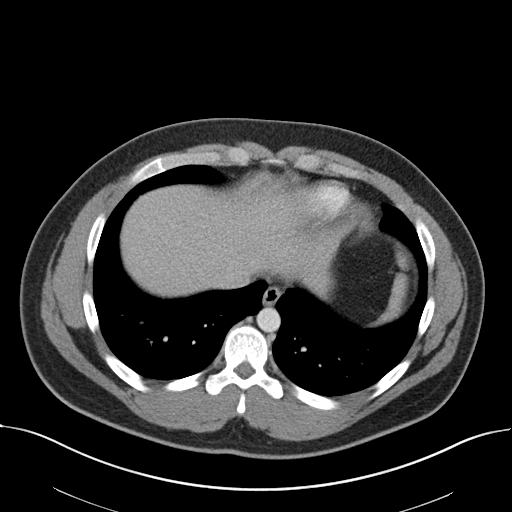

[Series 5: coronal st · coronal · 0.92mm/px · 3 of 147 slices shown]
[im 49/147  soft-tissue]
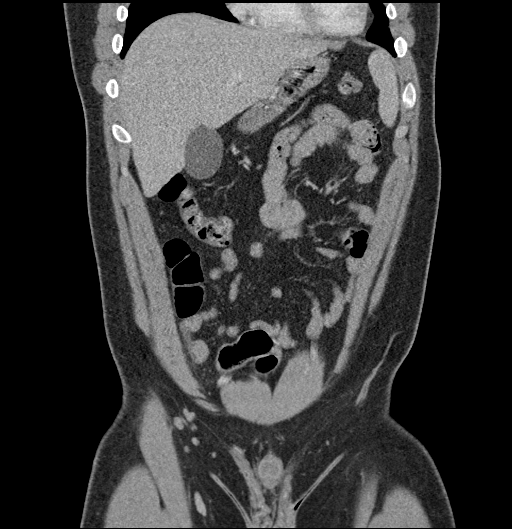
[im 65/147  soft-tissue]
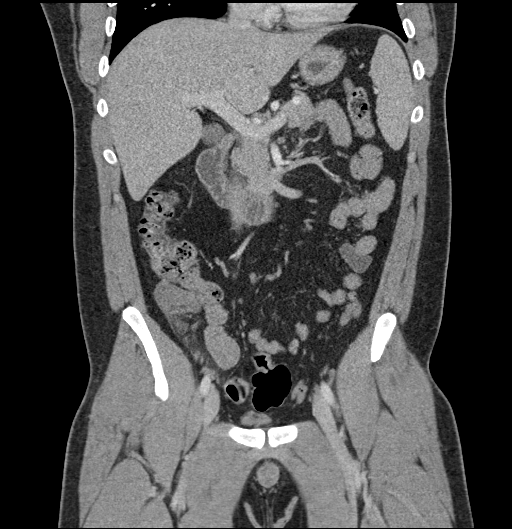
[im 82/147  soft-tissue]
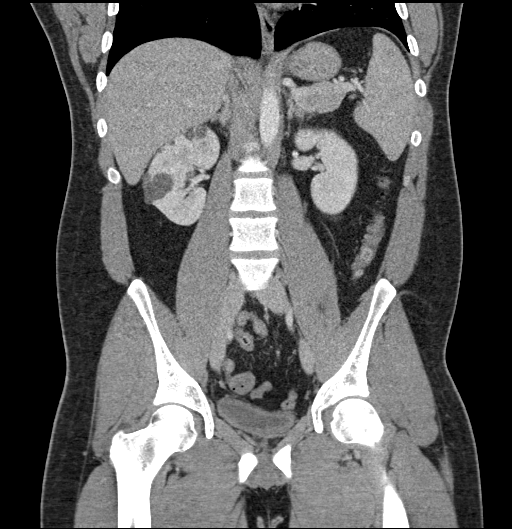

[17 of 46 positions shown; findings below may reference images not displayed]

FINDINGS: Lower chest: No acute abnormality.

Hepatobiliary: No focal liver abnormality is seen. No gallstones,
gallbladder wall thickening, or biliary dilatation.

Pancreas: Unremarkable. No pancreatic ductal dilatation or
surrounding inflammatory changes.

Spleen: Normal in size without focal abnormality.

Adrenals/Urinary Tract: Adrenal glands are unremarkable. There are
innumerable right renal cysts, majority are subcentimeter, largest
in the midpole measures 3.2 cm. No suspicious solid lesion. Normal
appearance of the left kidney. No hydronephrosis or renal calculi.
Unremarkable urinary bladder. No

Stomach/Bowel: Stomach is within normal limits. The appendix is
diffusely dilated measuring 1.2 cm in diameter with periappendiceal
fat stranding. There is no evidence of free air or adjacent fluid
collection. No evidence of bowel wall thickening, distention, or
inflammatory changes.

Vascular/Lymphatic: No significant vascular findings are present. No
enlarged abdominal or pelvic lymph nodes.

Reproductive: Prostate is unremarkable.

Other: Trace free fluid in the pelvis.  No abdominal wall hernia.

Musculoskeletal: No acute or significant osseous findings.
IMPRESSION: 1. Acute uncomplicated appendicitis.
2. Innumerable right renal cysts, majority of which are
subcentimeter, largest in the midpole measures 3.2 cm. Consider
follow-up renal ultrasound in 6 months.

## 2024-03-14 ENCOUNTER — Ambulatory Visit (INDEPENDENT_AMBULATORY_CARE_PROVIDER_SITE_OTHER): Payer: Self-pay | Admitting: Family Medicine

## 2024-03-14 ENCOUNTER — Encounter: Payer: Self-pay | Admitting: Family Medicine

## 2024-03-14 VITALS — BP 142/96 | HR 78 | Temp 97.4°F | Ht 65.5 in | Wt 216.0 lb

## 2024-03-14 DIAGNOSIS — J302 Other seasonal allergic rhinitis: Secondary | ICD-10-CM | POA: Diagnosis not present

## 2024-03-14 DIAGNOSIS — K219 Gastro-esophageal reflux disease without esophagitis: Secondary | ICD-10-CM | POA: Diagnosis not present

## 2024-03-14 DIAGNOSIS — Z72820 Sleep deprivation: Secondary | ICD-10-CM

## 2024-03-14 DIAGNOSIS — R053 Chronic cough: Secondary | ICD-10-CM | POA: Diagnosis not present

## 2024-03-14 DIAGNOSIS — R0683 Snoring: Secondary | ICD-10-CM

## 2024-03-14 DIAGNOSIS — Z9189 Other specified personal risk factors, not elsewhere classified: Secondary | ICD-10-CM

## 2024-03-14 DIAGNOSIS — J351 Hypertrophy of tonsils: Secondary | ICD-10-CM

## 2024-03-14 MED ORDER — LEVOCETIRIZINE DIHYDROCHLORIDE 5 MG PO TABS: 5.0000 mg | ORAL_TABLET | Freq: Every evening | ORAL | 3 refills | Status: DC

## 2024-03-14 MED ORDER — FLUTICASONE PROPIONATE 50 MCG/ACT NA SUSP: 2.0000 | Freq: Every day | NASAL | 6 refills | Status: DC

## 2024-03-14 MED ORDER — ESOMEPRAZOLE MAGNESIUM 40 MG PO CPDR: DELAYED_RELEASE_CAPSULE | ORAL | 3 refills | Status: DC

## 2024-03-14 NOTE — Progress Notes (Signed)
 Assessment & Plan   Assessment/Plan:    Assessment & Plan Chronic Cough Chronic cough since January, likely due to laryngeal reflux disease, exacerbated by secondhand smoke exposure. No fever, chills, or hemoptysis. Cough worsens with smoke exposure and improves when away. ENT evaluation in 2020 suggested laryngeal reflux. Possible contribution from seasonal allergies. - Prescribe esomeprazole 40 mg to be taken 30 minutes before breakfast. - Order chest x-ray to rule out underlying lung abnormalities. - Recommend avoidance of lung irritants such as secondhand smoke. - Start fluticasone nasal spray and an antihistamine like azelastine for potential allergic rhinitis.  Fatigue General fatigue possibly related to poor sleep quality due to chronic cough, potential sleep apnea, and work-related stress. Possible contribution from laryngeal reflux affecting sleep quality. - Evaluate improvement in fatigue with treatment of chronic cough and reflux. - Discuss sleep hygiene practices, including reducing caffeine and alcohol intake, and minimizing blue light exposure before bedtime.  Potential Sleep Apnea Possible sleep apnea suggested by snoring and large tonsils. No episodes of waking up gasping for air reported. Large tonsils may contribute to sleep apnea and may require ENT evaluation if symptoms persist. - Monitor symptoms and consider referral to ENT or sleep study if symptoms persist or worsen.  Hypertension Mildly elevated blood pressure at 144/90 mmHg, possibly due to anxiety during the visit (white coat hypertension). - Recheck blood pressure at the next visit.      There are no discontinued medications.  No follow-ups on file.        Subjective:   Encounter date: 03/14/2024  Nicholas Dyer is a 23 y.o. male who has Chronic tonsillitis; Acute pharyngitis; Laryngopharyngeal reflux (LPR); and Acute phlegmonous appendicitis s/p lap appendectomy 07/31/2020 on their problem  list..   He  has no past medical history on file.Toniann Dickerson Aas   He presents with chief complaint of Establish Care, Cough (Intermittent cough since January feels like lungs are full, like something is in them ), and Fatigue (For a year thought it was working 80+ hours ) .   Discussed the use of AI scribe software for clinical note transcription with the patient, who gave verbal consent to proceed.  History of Present Illness Nicholas Dyer is a 23 year old male who presents with chronic cough and fatigue.  He has experienced a persistent cough since January, describing it as feeling like his lungs are full, possibly with mucus. The cough is intermittent and worsens with exposure to secondhand smoke, particularly from his grandfather, but improves when away from such environments. He has a history of recurrent bronchitis during high school, especially during football season, which resolved but has since recurred. No hemoptysis, fever, or chills.  He experiences occasional sore throats in the mornings and has noticed heartburn symptoms, particularly after consuming spicy foods. He was evaluated by an ENT in 2020, who suggested laryngeal reflux as a possible cause. He was previously on Prilosec during middle school, which he discontinued after improvement. Currently, he is not on any regular medications for his cough or reflux.  He reports general fatigue, which he attributes to poor sleep. He mentions a history of working long hours and getting minimal sleep during school. He occasionally snores and has been told he might have sleep apnea. No history of waking up gasping for air. He has a history of seasonal allergies, for which he sometimes takes Zyrtec, but often does not treat.  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  03/14/2024    8:49 AM  Depression screen PHQ 2/9  Decreased Interest 0  Down, Depressed, Hopeless 0  PHQ - 2 Score 0  Altered sleeping 3  Tired,  decreased energy 3  Change in appetite 1  Feeling bad or failure about yourself  1  Trouble concentrating 0  Moving slowly or fidgety/restless 0  Suicidal thoughts 0  PHQ-9 Score 8  Difficult doing work/chores Not difficult at all      03/14/2024    8:50 AM  GAD 7 : Generalized Anxiety Score  Nervous, Anxious, on Edge 1  Control/stop worrying 1  Worry too much - different things 1  Trouble relaxing 0  Restless 0  Easily annoyed or irritable 3  Afraid - awful might happen 0  Total GAD 7 Score 6  Anxiety Difficulty Not difficult at all      ROS  Past Surgical History:  Procedure Laterality Date   LAPAROSCOPIC APPENDECTOMY N/A 07/31/2020   Procedure: APPENDECTOMY LAPAROSCOPIC;  Surgeon: Candyce Champagne, MD;  Location: [facility];  Service: General;  Laterality: N/A;    Outpatient Medications Prior to Visit  Medication Sig Dispense Refill   ibuprofen  (ADVIL ) 200 MG tablet Take 200-400 mg by mouth every 6 (six) hours as needed for fever, headache or mild pain.     Multiple Vitamin (MULTIVITAMIN) tablet Take 1 tablet by mouth daily.     bismuth  subsalicylate (PEPTO BISMOL) 262 MG chewable tablet Chew 524 mg by mouth as needed for indigestion. (Patient not taking: Reported on 03/14/2024)     hydrOXYzine  (ATARAX /VISTARIL ) 25 MG tablet Take 0.5-1 tablets (12.5-25 mg total) by mouth every 8 (eight) hours as needed for itching. (Patient not taking: Reported on 03/14/2024) 30 tablet 0   Probiotic Product (PROBIOTIC PO) Take 1 capsule by mouth daily. (Patient not taking: Reported on 03/14/2024)     traMADol  (ULTRAM ) 50 MG tablet Take 1-2 tablets (50-100 mg total) by mouth every 6 (six) hours as needed for moderate pain or severe pain. (Patient not taking: Reported on 03/14/2024) 20 tablet 0   No facility-administered medications prior to visit.    Family History  Problem Relation Age of Onset   Healthy Mother    Healthy Father    Diabetes Maternal Grandfather    Skin cancer Paternal  Grandfather     Social History   Socioeconomic History   Marital status: Single    Spouse name: Not on file   Number of children: Not on file   Years of education: Not on file   Highest education level: Not on file  Occupational History   Not on file  Tobacco Use   Smoking status: Never    Passive exposure: Current   Smokeless tobacco: Former    Types: Chew    Quit date: 2023  Vaping Use   Vaping status: Never Used  Substance and Sexual Activity   Alcohol use: Yes    Comment: social setting every other month   Drug use: Never   Sexual activity: Not Currently  Other Topics Concern   Not on file  Social History Narrative   Not on file   Social Drivers of Health   Financial Resource Strain: Low Risk  (03/14/2024)   Overall Financial Resource Strain (CARDIA)    Difficulty of Paying Living Expenses: Not hard at all  Food Insecurity: No Food Insecurity (03/14/2024)   Hunger Vital Sign    Worried About Running Out of Food in the Last Year: Never true  Ran Out of Food in the Last Year: Never true  Transportation Needs: No Transportation Needs (03/14/2024)   PRAPARE - Administrator, Civil Service (Medical): No    Lack of Transportation (Non-Medical): No  Physical Activity: Sufficiently Active (03/14/2024)   Exercise Vital Sign    Days of Exercise per Week: 4 days    Minutes of Exercise per Session: 50 min  Stress: Stress Concern Present (03/14/2024)   [employer] of Occupational Health - Occupational Stress Questionnaire    Feeling of Stress: Very much  Social Connections: Moderately Isolated (03/14/2024)   Social Connection and Isolation Panel    Frequency of Communication with Friends and Family: More than three times a week    Frequency of Social Gatherings with Friends and Family: More than three times a week    Attends Religious Services: More than 4 times per year    Active Member of [employer] or Organizations: No    Attends Banker  Meetings: Never    Marital Status: Never married  Intimate Partner Violence: Not At Risk (03/14/2024)   Humiliation, Afraid, Rape, and Kick questionnaire    Fear of Current or Ex-Partner: No    Emotionally Abused: No    Physically Abused: No    Sexually Abused: No                                                                                                  Objective:  Physical Exam: Pulse 78   Temp (!) 97.4 F (36.3 C) (Temporal)   Ht 5' 5.5 (1.664 m)   Wt 216 lb (98 kg)   SpO2 99%   BMI 35.40 kg/m    BP Readings from Last 3 Encounters:  03/14/24 (!) 144/90  08/01/20 (!) 118/58  05/27/20 128/84    Physical Exam  MEASUREMENTS: BMI- 35.0. GENERAL: Alert, cooperative, well developed, no acute distress HEENT: Normocephalic, enlarged tonsils, moist mucous membranes CHEST: Clear to auscultation bilaterally, no wheezes, rhonchi, or crackles CARDIOVASCULAR: Normal heart rate and rhythm, S1 and S2 normal without murmurs ABDOMEN: Soft, non-tender, non-distended, without organomegaly, normal bowel sounds EXTREMITIES: No cyanosis or edema NEUROLOGICAL: Cranial nerves grossly intact, moves all extremities without gross motor or sensory deficit   Physical Exam  No results found.  No results found for this or any previous visit (from the past 2160 hours).      Carnell Christian, MD, MS

## 2024-03-14 NOTE — Patient Instructions (Signed)
  VISIT SUMMARY: You came in today because of a chronic cough and fatigue. We discussed your symptoms, including your history of recurrent bronchitis, possible laryngeal reflux, and potential sleep apnea. We also noted your mildly elevated blood pressure.  YOUR PLAN: -CHRONIC COUGH: Your chronic cough is likely due to laryngeal reflux disease, which is when stomach acid backs up into your throat, causing irritation. It may also be worsened by secondhand smoke and seasonal allergies. We have prescribed esomeprazole 40 mg to be taken 30 minutes before breakfast to help reduce stomach acid. We also recommend avoiding lung irritants like secondhand smoke. Additionally, we are starting you on fluticasone nasal spray and an antihistamine like azelastine to manage potential allergies. A chest x-ray has been ordered to rule out any underlying lung issues.  -FATIGUE: Your fatigue may be related to poor sleep quality, which could be due to your chronic cough, potential sleep apnea, and work-related stress. We will evaluate if your fatigue improves with the treatment of your chronic cough and reflux. We also discussed sleep hygiene practices, such as reducing caffeine and alcohol intake and minimizing blue light exposure before bedtime.  -POTENTIAL SLEEP APNEA: Sleep apnea is a condition where your breathing stops and starts during sleep, often due to blocked airways. You have symptoms like snoring and large tonsils that may suggest sleep apnea. We will monitor your symptoms and may refer you to an ENT specialist or for a sleep study if your symptoms persist or worsen.  -HYPERTENSION: Your blood pressure was slightly high at 144/90 mmHg, which might be due to anxiety during the visit, known as white coat hypertension. We will recheck your blood pressure at your next visit.  INSTRUCTIONS: Please take the esomeprazole 40 mg 30 minutes before breakfast daily. Use the fluticasone nasal spray and antihistamine as  directed. Avoid exposure to secondhand smoke and follow the sleep hygiene practices we discussed. We will recheck your blood pressure at your next visit. If your symptoms of potential sleep apnea persist or worsen, we may refer you to an ENT specialist or for a sleep study. A chest x-ray has been ordered to rule out any underlying lung issues.  For chest xray, go to:    [place] [facility] [address] [place], [place], [place] [address] Phone: [phone]

## 2024-04-25 ENCOUNTER — Encounter: Admitting: Family Medicine

## 2024-05-16 ENCOUNTER — Encounter: Payer: Self-pay | Admitting: Family Medicine

## 2024-05-16 ENCOUNTER — Ambulatory Visit: Admitting: Family Medicine

## 2024-05-16 VITALS — BP 122/80 | HR 60 | Temp 97.3°F | Resp 18 | Wt 200.4 lb

## 2024-05-16 DIAGNOSIS — J3501 Chronic tonsillitis: Secondary | ICD-10-CM | POA: Diagnosis not present

## 2024-05-16 DIAGNOSIS — R053 Chronic cough: Secondary | ICD-10-CM

## 2024-05-16 DIAGNOSIS — Z0000 Encounter for general adult medical examination without abnormal findings: Secondary | ICD-10-CM

## 2024-05-16 DIAGNOSIS — K219 Gastro-esophageal reflux disease without esophagitis: Secondary | ICD-10-CM | POA: Diagnosis not present

## 2024-05-16 DIAGNOSIS — Z23 Encounter for immunization: Secondary | ICD-10-CM

## 2024-05-16 DIAGNOSIS — E6609 Other obesity due to excess calories: Secondary | ICD-10-CM

## 2024-05-16 DIAGNOSIS — Z1159 Encounter for screening for other viral diseases: Secondary | ICD-10-CM

## 2024-05-16 DIAGNOSIS — Z114 Encounter for screening for human immunodeficiency virus [HIV]: Secondary | ICD-10-CM

## 2024-05-16 DIAGNOSIS — E66811 Obesity, class 1: Secondary | ICD-10-CM

## 2024-05-16 DIAGNOSIS — J302 Other seasonal allergic rhinitis: Secondary | ICD-10-CM | POA: Diagnosis not present

## 2024-05-16 DIAGNOSIS — Z6832 Body mass index (BMI) 32.0-32.9, adult: Secondary | ICD-10-CM | POA: Diagnosis not present

## 2024-05-16 DIAGNOSIS — J351 Hypertrophy of tonsils: Secondary | ICD-10-CM

## 2024-05-16 LAB — LIPID PANEL
Cholesterol: 180 mg/dL (ref 0–200)
HDL: 28.9 mg/dL — ABNORMAL LOW (ref >39.00)
LDL Cholesterol: 115 mg/dL — ABNORMAL HIGH (ref 0–99)
NonHDL: 151.5
Total CHOL/HDL Ratio: 6
Triglycerides: 183 mg/dL — ABNORMAL HIGH (ref 0.0–149.0)
VLDL: 36.6 mg/dL (ref 0.0–40.0)

## 2024-05-16 LAB — CBC WITH DIFFERENTIAL/PLATELET
Basophils Absolute: 0 K/uL (ref 0.0–0.1)
Basophils Relative: 0.8 % (ref 0.0–3.0)
Eosinophils Absolute: 0.1 K/uL (ref 0.0–0.7)
Eosinophils Relative: 2.6 % (ref 0.0–5.0)
HCT: 48 % (ref 39.0–52.0)
Hemoglobin: 16.2 g/dL (ref 13.0–17.0)
Lymphocytes Relative: 38.2 % (ref 12.0–46.0)
Lymphs Abs: 1.8 K/uL (ref 0.7–4.0)
MCHC: 33.8 g/dL (ref 30.0–36.0)
MCV: 90.4 fl (ref 78.0–100.0)
Monocytes Absolute: 0.5 K/uL (ref 0.1–1.0)
Monocytes Relative: 9.7 % (ref 3.0–12.0)
Neutro Abs: 2.3 K/uL (ref 1.4–7.7)
Neutrophils Relative %: 48.7 % (ref 43.0–77.0)
Platelets: 317 K/uL (ref 150.0–400.0)
RBC: 5.31 Mil/uL (ref 4.22–5.81)
RDW: 12.1 % (ref 11.5–15.5)
WBC: 4.8 K/uL (ref 4.0–10.5)

## 2024-05-16 LAB — TSH: TSH: 0.51 u[IU]/mL (ref 0.35–5.50)

## 2024-05-16 LAB — COMPREHENSIVE METABOLIC PANEL WITH GFR
ALT: 48 U/L (ref 0–53)
AST: 21 U/L (ref 0–37)
Albumin: 5.1 g/dL (ref 3.5–5.2)
Alkaline Phosphatase: 80 U/L (ref 39–117)
BUN: 15 mg/dL (ref 6–23)
CO2: 27 meq/L (ref 19–32)
Calcium: 9.6 mg/dL (ref 8.4–10.5)
Chloride: 105 meq/L (ref 96–112)
Creatinine, Ser: 0.97 mg/dL (ref 0.40–1.50)
GFR: 109.4 mL/min (ref >60.00)
Glucose, Bld: 92 mg/dL (ref 70–99)
Potassium: 4.2 meq/L (ref 3.5–5.1)
Sodium: 141 meq/L (ref 135–145)
Total Bilirubin: 1 mg/dL (ref 0.2–1.2)
Total Protein: 7.6 g/dL (ref 6.0–8.3)

## 2024-05-16 LAB — MICROALBUMIN / CREATININE URINE RATIO
Creatinine,U: 334 mg/dL
Microalb Creat Ratio: 6.3 mg/g (ref 0.0–30.0)
Microalb, Ur: 2.1 mg/dL — ABNORMAL HIGH (ref 0.0–1.9)

## 2024-05-16 LAB — HEMOGLOBIN A1C: Hgb A1c MFr Bld: 5.2 % (ref 4.6–6.5)

## 2024-05-16 MED ORDER — FLUTICASONE PROPIONATE 50 MCG/ACT NA SUSP: 2.0000 | Freq: Every day | NASAL | 11 refills | Status: AC

## 2024-05-16 MED ORDER — LEVOCETIRIZINE DIHYDROCHLORIDE 5 MG PO TABS: 5.0000 mg | ORAL_TABLET | Freq: Every evening | ORAL | 3 refills | Status: AC

## 2024-05-16 MED ORDER — ESOMEPRAZOLE MAGNESIUM 40 MG PO CPDR: DELAYED_RELEASE_CAPSULE | ORAL | 3 refills | Status: AC

## 2024-05-16 NOTE — Progress Notes (Signed)
 Assessment  Assessment/Plan:  Assessment and Plan Assessment & Plan Adult Wellness Visit Annual wellness visit conducted. Blood pressure improved at 122/80 mmHg. Weight decreased by 16 pounds through calorie restriction. No new concerns reported. Overall health parameters appear stable. - Perform physical examination - Order baseline blood work including blood counts, liver function tests, kidney function tests, electrolytes, thyroid function tests, and cholesterol - Provide HPV vaccine - Encourage continued weight management and healthy lifestyle  Obesity BMI is 32.8, indicating obesity. Weight loss of 16 pounds achieved through calorie restriction. No significant exercise reported, but increased physical activity through work-related tasks. Screening for endocrine and metabolic conditions related to obesity planned. 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 Current treatment includes levocetirizine, fluticasone  nasal spray, and esomeprazole . Cough improves with regular medication use and recurs when medication is missed. No new symptoms reported. - Continue levocetirizine 5 mg daily - Continue fluticasone  nasal spray 50 mcg, two sprays per nostril daily - Continue esomeprazole  40 mg daily  Chronic tonsillar hypertrophy and chronic tonsillitis Chronic tonsillar hypertrophy and tonsillitis. Previous discussions included potential referral to ENT and sleep study. Tonsils appear slightly reduced in size. Experienced a single episode of sleep paralysis with choking sensation, which has not recurred.     Medications Discontinued During This Encounter  Medication Reason   esomeprazole  (NEXIUM ) 40 MG  capsule Reorder   fluticasone  (FLONASE ) 50 MCG/ACT nasal spray Reorder   levocetirizine (XYZAL ) 5 MG tablet Reorder    Patient Counseling(The following topics were reviewed and/or handout was given):  -Nutrition: Stressed importance of moderation in sodium/caffeine intake, saturated fat and cholesterol, caloric balance, sufficient intake of fresh fruits, vegetables, and fiber.  -Stressed the importance of regular exercise.   -Substance Abuse: Discussed cessation/primary prevention of tobacco, alcohol, or other drug use; driving or other dangerous activities under the influence; availability of treatment for abuse.   -Injury prevention: Discussed safety belts, safety helmets, smoke detector, smoking near bedding or upholstery.   -Sexuality: Discussed sexually transmitted diseases, partner selection, use of condoms, avoidance of unintended pregnancy and contraceptive alternatives.   -Dental health: Discussed importance of regular tooth brushing, flossing, and dental visits.  -Health maintenance and immunizations reviewed. Please refer to Health maintenance section.  Return in about 1 year (around 05/16/2025) for physical (fasting labs).        Subjective:   Encounter date: 05/16/2024  Chief Complaint  Patient presents with   Annual Exam    Pt is fasting today; no concerns   HM due- HPV vaccine     Discussed the use of AI scribe software for clinical note transcription with the patient, who gave verbal consent to proceed.  History of Present Illness Nicholas Dyer is a 23 year old male who presents for an annual physical exam.  He has a history of chronic cough, previously attributed to allergies, secondhand smoke exposure, and laryngeal reflux. He was started on omeprazole 40 mg daily at his last visit. His cough improves with regular use of allergy medications, including levocetirizine 5 mg daily and fluticasone  nasal spray 50 mcg, two sprays per nostril daily. However, the  cough returns if he skips doses, particularly when planning to consume alcohol.  He has a history of chronic tonsillitis and  tonsillar hypertrophy. There was a previous discussion about a referral to ENT and a sleep study. He reports improved sleep and denies current snoring or waking up gasping for air. He experienced sleep paralysis once, which was a frightening event, but it has not recurred.  He has lost 16 pounds by monitoring his calorie intake, initially consuming 2000 calories per day and then reducing to 1900 calories. He is physically active at work, climbing ladders, which contributes to his daily physical activity.  No chest pain, shortness of breath, abdominal discomfort, trouble urinating, leg swelling, or vision problems. He reports improved sleep and reduced fatigue. He acknowledges feeling easily annoyed or irritable but denies anxiety or depression.       05/16/2024    1:34 PM 03/14/2024    8:49 AM  Depression screen PHQ 2/9  Decreased Interest 0 0  Down, Depressed, Hopeless 0 0  PHQ - 2 Score 0 0  Altered sleeping 1 3  Tired, decreased energy 1 3  Change in appetite 0 1  Feeling bad or failure about yourself  0 1  Trouble concentrating 0 0  Moving slowly or fidgety/restless 0 0  Suicidal thoughts 0 0  PHQ-9 Score 2 8  Difficult doing work/chores Somewhat difficult Not difficult at all       05/16/2024    1:35 PM 03/14/2024    8:50 AM  GAD 7 : Generalized Anxiety Score  Nervous, Anxious, on Edge 1 1  Control/stop worrying 0 1  Worry too much - different things 1 1  Trouble relaxing 0 0  Restless 0 0  Easily annoyed or irritable 3 3  Afraid - awful might happen 0 0  Total GAD 7 Score 5 6  Anxiety Difficulty Not difficult at all Not difficult at all    Health Maintenance Due  Topic Date Due   HPV VACCINES (1 - Male 3-dose series) Never done   HIV Screening  Never done   Hepatitis C Screening  Never done   COVID-19 Vaccine (1 - 2024-25 season) Never done    INFLUENZA VACCINE  04/25/2024     PMH:  The following were reviewed and entered/updated in epic: History reviewed. No pertinent past medical history.  Patient Active Problem List   Diagnosis Date Noted   Acute phlegmonous appendicitis s/p lap appendectomy 07/31/2020 07/31/2020   Chronic tonsillitis 08/18/2019   Acute pharyngitis 08/18/2019   Laryngopharyngeal reflux (LPR) 08/18/2019    Past Surgical History:  Procedure Laterality Date   LAPAROSCOPIC APPENDECTOMY N/A 07/31/2020   Procedure: APPENDECTOMY LAPAROSCOPIC;  Surgeon: Sheldon Standing, MD;  Location: [facility];  Service: General;  Laterality: N/A;    Family History  Problem Relation Age of Onset   Healthy Mother    Healthy Father    Diabetes Maternal Grandfather    Skin cancer Paternal Grandfather     Medications- reviewed and updated Outpatient Medications Prior to Visit  Medication Sig Dispense Refill   ibuprofen  (ADVIL ) 200 MG tablet Take 200-400 mg by mouth every 6 (six) hours as needed for fever, headache or mild pain.     Multiple Vitamin (MULTIVITAMIN) tablet Take 1 tablet by mouth daily.     esomeprazole  (NEXIUM ) 40 MG capsule Take 30 min before breakfast 30 capsule 3   fluticasone  (FLONASE ) 50 MCG/ACT nasal spray Place 2 sprays into both nostrils daily. 16 g 6   levocetirizine (XYZAL ) 5 MG tablet Take 1 tablet (5 mg total) by mouth every evening. 90 tablet 3   No facility-administered  medications prior to visit.    No Known Allergies  Social History   Socioeconomic History   Marital status: Single    Spouse name: Not on file   Number of children: Not on file   Years of education: Not on file   Highest education level: Not on file  Occupational History   Not on file  Tobacco Use   Smoking status: Never    Passive exposure: Current   Smokeless tobacco: Former    Types: Chew    Quit date: 2023  Vaping Use   Vaping status: Never Used  Substance and Sexual Activity   Alcohol use: Yes    Comment:  social setting every other month   Drug use: Never   Sexual activity: Not Currently  Other Topics Concern   Not on file  Social History Narrative   Not on file   Social Drivers of Health   Financial Resource Strain: Low Risk  (03/14/2024)   Overall Financial Resource Strain (CARDIA)    Difficulty of Paying Living Expenses: Not hard at all  Food Insecurity: No Food Insecurity (03/14/2024)   Hunger Vital Sign    Worried About Running Out of Food in the Last Year: Never true    Ran Out of Food in the Last Year: Never true  Transportation Needs: No Transportation Needs (03/14/2024)   PRAPARE - Administrator, Civil Service (Medical): No    Lack of Transportation (Non-Medical): No  Physical Activity: Sufficiently Active (03/14/2024)   Exercise Vital Sign    Days of Exercise per Week: 4 days    Minutes of Exercise per Session: 50 min  Stress: Stress Concern Present (03/14/2024)   [employer] of Occupational Health - Occupational Stress Questionnaire    Feeling of Stress: Very much  Social Connections: Moderately Isolated (03/14/2024)   Social Connection and Isolation Panel    Frequency of Communication with Friends and Family: More than three times a week    Frequency of Social Gatherings with Friends and Family: More than three times a week    Attends Religious Services: More than 4 times per year    Active Member of [employer] or Organizations: No    Attends Engineer, structural: Never    Marital Status: Never married           Objective:  Physical Exam: BP 122/80 (BP Location: Left Arm, Patient Position: Sitting, Cuff Size: Normal)   Pulse 60   Temp (!) 97.3 F (36.3 C) (Temporal)   Resp 18   Wt 200 lb 6.4 oz (90.9 kg)   SpO2 98%   BMI 32.84 kg/m   Body mass index is 32.84 kg/m. Wt Readings from Last 3 Encounters:  05/16/24 200 lb 6.4 oz (90.9 kg)  03/14/24 216 lb (98 kg)  05/20/20 210 lb (95.3 kg)    Physical Exam Constitutional:       General: He is not in acute distress.    Appearance: Normal appearance. He is not ill-appearing or toxic-appearing.  HENT:     Head: Normocephalic and atraumatic.     Right Ear: Hearing, tympanic membrane, ear canal and external ear normal. There is no impacted cerumen.     Left Ear: Hearing, tympanic membrane, ear canal and external ear normal. There is no impacted cerumen.     Nose: Nose normal. No congestion.     Mouth/Throat:     Lips: No lesions.     Mouth: Mucous membranes are moist.  Pharynx: Oropharynx is clear. Uvula midline. No oropharyngeal exudate.     Tonsils: No tonsillar exudate or tonsillar abscesses. 2+ on the right. 2+ on the left.  Eyes:     General: No scleral icterus.       Right eye: No discharge.        Left eye: No discharge.     Conjunctiva/sclera: Conjunctivae normal.     Pupils: Pupils are equal, round, and reactive to light.  Neck:     Thyroid: No thyroid mass, thyromegaly or thyroid tenderness.  Cardiovascular:     Rate and Rhythm: Normal rate and regular rhythm.     Pulses: Normal pulses.     Heart sounds: Normal heart sounds.  Pulmonary:     Effort: Pulmonary effort is normal. No respiratory distress.     Breath sounds: Normal breath sounds.  Abdominal:     General: Abdomen is flat. Bowel sounds are normal.     Palpations: Abdomen is soft.  Musculoskeletal:        General: Normal range of motion.     Cervical back: Normal range of motion.     Right lower leg: No edema.     Left lower leg: No edema.  Lymphadenopathy:     Cervical: No cervical adenopathy.  Skin:    General: Skin is warm and dry.     Findings: No rash.  Neurological:     General: No focal deficit present.     Mental Status: He is alert and oriented to person, place, and time. Mental status is at baseline.     Deep Tendon Reflexes:     Reflex Scores:      Patellar reflexes are 2+ on the right side and 2+ on the left side. Psychiatric:        Mood and Affect: Mood normal.         Behavior: Behavior normal.        Thought Content: Thought content normal.        Judgment: Judgment normal.         Prior labs:   No results found for this or any previous visit (from the past 2160 hours).  No results found for: CHOL No results found for: HDL No results found for: LDLCALC No results found for: TRIG No results found for: CHOLHDL No results found for: LDLDIRECT  Last metabolic panel Lab Results  Component Value Date   GLUCOSE 97 07/31/2020   NA 141 07/31/2020   K 4.6 07/31/2020   CL 105 07/31/2020   CO2 26 07/31/2020   BUN 18 07/31/2020   CREATININE 0.98 07/31/2020   GFRNONAA >60 07/31/2020   CALCIUM 9.8 07/31/2020   PROT 8.4 (H) 07/31/2020   ALBUMIN 5.5 (H) 07/31/2020   BILITOT 1.3 (H) 07/31/2020   ALKPHOS 75 07/31/2020   AST 25 07/31/2020   ALT 34 07/31/2020   ANIONGAP 10 07/31/2020    No results found for: HGBA1C  Last CBC Lab Results  Component Value Date   WBC 16.4 (H) 07/31/2020   HGB 16.6 07/31/2020   HCT 48.6 07/31/2020   MCV 91.4 07/31/2020   MCH 31.2 07/31/2020   RDW 12.2 07/31/2020   PLT 341 07/31/2020    No results found for: TSH  No results found for: PSA1, PSA  Last vitamin D No results found for: MARIEN BOLLS, VD25OH  Lab Results  Component Value Date   BILIRUBINUR NEGATIVE 07/31/2020   PROTEINUR NEGATIVE 07/31/2020   LEUKOCYTESUR NEGATIVE 07/31/2020  No results found for: LABMICR, MICROALBUR   At today's visit, we discussed treatment options, associated risk and benefits, and engage in counseling as needed.  Additionally the following were reviewed: Past medical records, past medical and surgical history, family and social background, as well as relevant laboratory results, imaging findings, and specialty notes, where applicable.  This message was generated using dictation software, and as a result, it may contain unintentional typos or errors.  Nevertheless, extensive  effort was made to accurately convey at the pertinent aspects of the patient visit.    There may have been are other unrelated non-urgent complaints, but due to the busy schedule and the amount of time already spent with him, time does not permit to address these issues at today's visit. Another appointment may have or has been requested to review these additional issues.     Arvella Hummer, MD, MS

## 2024-05-17 LAB — URINALYSIS W MICROSCOPIC + REFLEX CULTURE
Bacteria, UA: NONE SEEN /HPF
Bilirubin Urine: NEGATIVE
Glucose, UA: NEGATIVE
Hgb urine dipstick: NEGATIVE
Hyaline Cast: NONE SEEN /LPF
Ketones, ur: NEGATIVE
Leukocyte Esterase: NEGATIVE
Nitrites, Initial: NEGATIVE
Protein, ur: NEGATIVE
RBC / HPF: NONE SEEN /HPF (ref 0–2)
Specific Gravity, Urine: 1.027 (ref 1.001–1.035)
Squamous Epithelial / HPF: NONE SEEN /HPF (ref <=5)
WBC, UA: NONE SEEN /HPF (ref 0–5)
pH: 5.5 (ref 5.0–8.0)

## 2024-05-17 LAB — NO CULTURE INDICATED

## 2024-05-17 LAB — HIV ANTIBODY (ROUTINE TESTING W REFLEX): HIV 1&2 Ab, 4th Generation: NONREACTIVE

## 2024-05-17 LAB — HCV INTERPRETATION

## 2024-05-17 LAB — HCV AB W REFLEX TO QUANT PCR: HCV Ab: NONREACTIVE

## 2024-05-20 ENCOUNTER — Ambulatory Visit: Payer: Self-pay | Admitting: Family Medicine
